# Patient Record
Sex: Female | Born: 1945 | Hispanic: Yes | Marital: Married | Smoking: Former smoker
Health system: Southern US, Community
[De-identification: ages and names within clinical notes are randomized; demographics above are authoritative.]

## PROBLEM LIST (undated history)

## (undated) DIAGNOSIS — M8580 Other specified disorders of bone density and structure, unspecified site: Secondary | ICD-10-CM

## (undated) DIAGNOSIS — E785 Hyperlipidemia, unspecified: Secondary | ICD-10-CM

## (undated) DIAGNOSIS — E039 Hypothyroidism, unspecified: Secondary | ICD-10-CM

## (undated) DIAGNOSIS — E7800 Pure hypercholesterolemia, unspecified: Secondary | ICD-10-CM

## (undated) DIAGNOSIS — R609 Edema, unspecified: Secondary | ICD-10-CM

## (undated) DIAGNOSIS — E049 Nontoxic goiter, unspecified: Secondary | ICD-10-CM

## (undated) DIAGNOSIS — K219 Gastro-esophageal reflux disease without esophagitis: Secondary | ICD-10-CM

## (undated) DIAGNOSIS — E669 Obesity, unspecified: Secondary | ICD-10-CM

## (undated) DIAGNOSIS — R002 Palpitations: Secondary | ICD-10-CM

## (undated) DIAGNOSIS — T753XXA Motion sickness, initial encounter: Secondary | ICD-10-CM

## (undated) HISTORY — PX: EYE SURGERY: SHX253

## (undated) HISTORY — PX: ABDOMINAL HYSTERECTOMY: SHX81

---

## 2015-11-23 ENCOUNTER — Other Ambulatory Visit: Payer: Self-pay | Admitting: Family Medicine

## 2015-11-23 DIAGNOSIS — Z1231 Encounter for screening mammogram for malignant neoplasm of breast: Secondary | ICD-10-CM

## 2015-12-06 ENCOUNTER — Ambulatory Visit: Payer: Self-pay | Attending: Family Medicine

## 2016-04-03 ENCOUNTER — Ambulatory Visit
Admission: RE | Admit: 2016-04-03 | Discharge: 2016-04-03 | Disposition: A | Discharge status: Home / Self Care | Payer: Medicare HMO | Source: Ambulatory Visit | Attending: Family Medicine | Admitting: Family Medicine

## 2016-04-03 ENCOUNTER — Other Ambulatory Visit: Payer: Self-pay | Admitting: Family Medicine

## 2016-04-03 DIAGNOSIS — Z1231 Encounter for screening mammogram for malignant neoplasm of breast: Secondary | ICD-10-CM | POA: Diagnosis not present

## 2016-09-14 ENCOUNTER — Encounter: Payer: Self-pay | Admitting: *Deleted

## 2016-09-17 ENCOUNTER — Encounter: Admission: RE | Payer: Self-pay | Source: Ambulatory Visit

## 2016-09-17 ENCOUNTER — Ambulatory Visit: Admission: RE | Admit: 2016-09-17 | Payer: Medicare HMO | Source: Ambulatory Visit | Admitting: Gastroenterology

## 2016-09-17 HISTORY — DX: Obesity, unspecified: E66.9

## 2016-09-17 HISTORY — DX: Gastro-esophageal reflux disease without esophagitis: K21.9

## 2016-09-17 HISTORY — DX: Hyperlipidemia, unspecified: E78.5

## 2016-09-17 HISTORY — DX: Nontoxic goiter, unspecified: E04.9

## 2016-09-17 HISTORY — DX: Other specified disorders of bone density and structure, unspecified site: M85.80

## 2016-09-17 SURGERY — COLONOSCOPY WITH PROPOFOL
Anesthesia: General

## 2016-11-27 ENCOUNTER — Encounter: Payer: Self-pay | Admitting: *Deleted

## 2016-11-28 ENCOUNTER — Encounter
Admission: RE | Disposition: A | Discharge status: Home / Self Care | Payer: Self-pay | Source: Ambulatory Visit | Attending: Unknown Physician Specialty

## 2016-11-28 ENCOUNTER — Ambulatory Visit
Admission: RE | Admit: 2016-11-28 | Discharge: 2016-11-28 | Disposition: A | Discharge status: Home / Self Care | Payer: Medicare HMO | Source: Ambulatory Visit | Attending: Unknown Physician Specialty | Admitting: Unknown Physician Specialty

## 2016-11-28 ENCOUNTER — Ambulatory Visit: Payer: Medicare HMO | Admitting: Certified Registered Nurse Anesthetist

## 2016-11-28 ENCOUNTER — Encounter: Payer: Self-pay | Admitting: *Deleted

## 2016-11-28 DIAGNOSIS — Z6829 Body mass index (BMI) 29.0-29.9, adult: Secondary | ICD-10-CM | POA: Insufficient documentation

## 2016-11-28 DIAGNOSIS — K640 First degree hemorrhoids: Secondary | ICD-10-CM | POA: Diagnosis not present

## 2016-11-28 DIAGNOSIS — Q438 Other specified congenital malformations of intestine: Secondary | ICD-10-CM | POA: Insufficient documentation

## 2016-11-28 DIAGNOSIS — M8580 Other specified disorders of bone density and structure, unspecified site: Secondary | ICD-10-CM | POA: Diagnosis not present

## 2016-11-28 DIAGNOSIS — K219 Gastro-esophageal reflux disease without esophagitis: Secondary | ICD-10-CM | POA: Insufficient documentation

## 2016-11-28 DIAGNOSIS — Z1211 Encounter for screening for malignant neoplasm of colon: Secondary | ICD-10-CM | POA: Insufficient documentation

## 2016-11-28 DIAGNOSIS — E669 Obesity, unspecified: Secondary | ICD-10-CM | POA: Diagnosis not present

## 2016-11-28 DIAGNOSIS — E7800 Pure hypercholesterolemia, unspecified: Secondary | ICD-10-CM | POA: Diagnosis not present

## 2016-11-28 DIAGNOSIS — Z79899 Other long term (current) drug therapy: Secondary | ICD-10-CM | POA: Diagnosis not present

## 2016-11-28 DIAGNOSIS — Z87891 Personal history of nicotine dependence: Secondary | ICD-10-CM | POA: Insufficient documentation

## 2016-11-28 HISTORY — DX: Pure hypercholesterolemia, unspecified: E78.00

## 2016-11-28 HISTORY — PX: COLONOSCOPY WITH PROPOFOL: SHX5780

## 2016-11-28 SURGERY — COLONOSCOPY WITH PROPOFOL
Anesthesia: General

## 2016-11-28 MED ORDER — MIDAZOLAM HCL 2 MG/2ML IJ SOLN
INTRAMUSCULAR | Status: DC | PRN
  Administered 2016-11-28: 2 mg via INTRAVENOUS

## 2016-11-28 MED ORDER — GLYCOPYRROLATE 0.2 MG/ML IJ SOLN
INTRAMUSCULAR | Status: DC | PRN
  Administered 2016-11-28: 0.2 mg via INTRAVENOUS

## 2016-11-28 MED ORDER — EPHEDRINE SULFATE 50 MG/ML IJ SOLN
INTRAMUSCULAR | Status: DC | PRN
  Administered 2016-11-28: 5 mg via INTRAVENOUS

## 2016-11-28 MED ORDER — SODIUM CHLORIDE 0.9 % IV SOLN
INTRAVENOUS | Status: DC
  Administered 2016-11-28: 15:00:00 via INTRAVENOUS
  Administered 2016-11-28: 1000 mL via INTRAVENOUS

## 2016-11-28 MED ORDER — PROPOFOL 500 MG/50ML IV EMUL
INTRAVENOUS | Status: DC | PRN
  Administered 2016-11-28: 140 ug/kg/min via INTRAVENOUS

## 2016-11-28 MED ORDER — LIDOCAINE HCL (PF) 2 % IJ SOLN
INTRAMUSCULAR | Status: AC
  Filled 2016-11-28: qty 2

## 2016-11-28 MED ORDER — PROPOFOL 10 MG/ML IV BOLUS
INTRAVENOUS | Status: DC | PRN
  Administered 2016-11-28: 30 mg via INTRAVENOUS

## 2016-11-28 MED ORDER — PROPOFOL 500 MG/50ML IV EMUL
INTRAVENOUS | Status: AC
  Filled 2016-11-28: qty 50

## 2016-11-28 MED ORDER — SODIUM CHLORIDE 0.9 % IV SOLN: INTRAVENOUS | Status: DC

## 2016-11-28 MED ORDER — MIDAZOLAM HCL 2 MG/2ML IJ SOLN
INTRAMUSCULAR | Status: AC
  Filled 2016-11-28: qty 2

## 2016-11-28 MED ORDER — LIDOCAINE HCL (CARDIAC) 20 MG/ML IV SOLN
INTRAVENOUS | Status: DC | PRN
  Administered 2016-11-28: 50 mg via INTRAVENOUS

## 2016-11-28 NOTE — H&P (Signed)
   Primary Care Physician:  Marisue IvanLINTHAVONG, KANHKA, MD Primary Gastroenterologist:  Dr. Mechele CollinElliott  Pre-Procedure History & Physical: HPI:  Jennifer AnesLuisa Koslow is a 71 y.o. female is here for an colonoscopy.   Past Medical History:  Diagnosis Date  . Enlarged thyroid   . GERD (gastroesophageal reflux disease)   . Hyperlipidemia   . Obesity   . Obesity   . Osteopenia   . Pure hypercholesterolemia with target low density lipoprotein (LDL) cholesterol less than 130 mg/dL     Past Surgical History:  Procedure Laterality Date  . ABDOMINAL HYSTERECTOMY    . CESAREAN SECTION      Prior to Admission medications   Medication Sig Start Date End Date Taking? Authorizing Provider  esomeprazole (NEXIUM) 40 MG capsule Take 40 mg by mouth daily at 12 noon.   Yes Historical Provider, MD  Melatonin-Pyridoxine (MELATIN PO) Take 1 tablet by mouth daily.   Yes Historical Provider, MD  metoCLOPramide (REGLAN) 5 MG tablet Take 5 mg by mouth 2 (two) times daily before a meal.   Yes Historical Provider, MD  simvastatin (ZOCOR) 20 MG tablet Take 20 mg by mouth daily.   Yes Historical Provider, MD  Zinc Acetate, Oral, (ZINC ACETATE PO) Take 25 mg by mouth daily.   Yes Historical Provider, MD  Calcium Carbonate-Vitamin D (CALTRATE 600+D) 600-400 MG-UNIT tablet Take 2 tablets by mouth daily.    Historical Provider, MD  Folic Acid-Vit B6-Vit B12 (FOLBEE) 2.5-25-1 MG TABS tablet Take 1 tablet by mouth daily.    Historical Provider, MD    Allergies as of 10/23/2016  . (No Known Allergies)    History reviewed. No pertinent family history.  Social History   Social History  . Marital status: Married    Spouse name: N/A  . Number of children: N/A  . Years of education: N/A   Occupational History  . Not on file.   Social History Main Topics  . Smoking status: Former Smoker    Quit date: 09/03/1998  . Smokeless tobacco: Never Used  . Alcohol use No  . Drug use: No  . Sexual activity: Not on file   Other  Topics Concern  . Not on file   Social History Narrative  . No narrative on file    Review of Systems: See HPI, otherwise negative ROS  Physical Exam: BP 131/61   Pulse 72   Temp 99.7 F (37.6 C) (Tympanic)   Resp 20   Ht 5\' 1"  (1.549 m)   Wt 70.3 kg (155 lb)   SpO2 100%   BMI 29.29 kg/m  General:   Alert,  pleasant and cooperative in NAD Head:  Normocephalic and atraumatic. Neck:  Supple; no masses or thyromegaly. Lungs:  Clear throughout to auscultation.    Heart:  Regular rate and rhythm. Abdomen:  Soft, nontender and nondistended. Normal bowel sounds, without guarding, and without rebound.   Neurologic:  Alert and  oriented x4;  grossly normal neurologically.  Impression/Plan: Jennifer Farmer is here for an colonoscopy to be performed for colon cancer screening  Risks, benefits, limitations, and alternatives regarding  colonoscopy have been reviewed with the patient.  Questions have been answered.  All parties agreeable.   Lynnae PrudeELLIOTT, Deandra Gadson, MD  11/28/2016, 3:36 PM

## 2016-11-28 NOTE — Anesthesia Post-op Follow-up Note (Cosign Needed)
Anesthesia QCDR form completed.        

## 2016-11-28 NOTE — Anesthesia Procedure Notes (Signed)
Date/Time: 11/28/2016 3:43 PM Performed by: Ginger CarneMICHELET, Kamariya Blevens Pre-anesthesia Checklist: Patient identified, Emergency Drugs available, Suction available, Patient being monitored and Timeout performed Patient Re-evaluated:Patient Re-evaluated prior to inductionOxygen Delivery Method: Nasal cannula

## 2016-11-28 NOTE — Op Note (Signed)
[facility]enter[facility] Patient Name: Jennifer Farmer Procedure Date: 11/28/2016 3:43 PM MRN: [id number] Account #: [id number] Date of Birth: [birth year] Admit Type: Outpatient Age: 71 Room: [facility] ENDO ROOM 4 Gender: Female Note Status: Finalized Procedure:            Colonoscopy Indications:          Screening for colorectal malignant neoplasm Providers:            Scot Jun, MD Referring MD:         Marisue Ivan (Referring MD) Medicines:            Propofol per Anesthesia Complications:        No immediate complications. Procedure:            Pre-Anesthesia Assessment:                       - After reviewing the risks and benefits, the patient                        was deemed in satisfactory condition to undergo the                        procedure.                       After obtaining informed consent, the colonoscope was                        passed under direct vision. Throughout the procedure,                        the patient's blood pressure, pulse, and oxygen                        saturations were monitored continuously. The                        Colonoscope was introduced through the anus and                        advanced to the the cecum, identified by appendiceal                        orifice and ileocecal valve. The colonoscopy was                        somewhat difficult due to restricted mobility of the                        colon and a tortuous colon. Successful completion of                        the procedure was aided by applying abdominal pressure.                        The patient tolerated the procedure well. The quality                        of the bowel preparation was excellent. Findings:      Internal hemorrhoids were found during endoscopy. The hemorrhoids were  medium-sized and Grade I (internal hemorrhoids that do not prolapse).       The colon was somewhat tortuous and difficult but was able to get  to       cecum.      The exam was otherwise without abnormality. Impression:           - Internal hemorrhoids.                       - The examination was otherwise normal.                       - No specimens collected. Recommendation:       - The findings and recommendations were discussed with                        the patient's family. Repeat in 10 years or not at all.                        Repeat in 5 years if a close relative has colon cancer                        or colon polyps. Scot Junobert T Keandria Berrocal, MD 11/28/2016 4:21:19 PM This report has been signed electronically. Number of Addenda: 0 Note Initiated On: 11/28/2016 3:43 PM Scope Withdrawal Time: 0 hours 10 minutes 2 seconds  Total Procedure Duration: 0 hours 23 minutes 14 seconds       [facility]

## 2016-11-28 NOTE — Anesthesia Preprocedure Evaluation (Signed)
Anesthesia Evaluation  Patient identified by MRN, date of birth, ID band Patient awake    Reviewed: Allergy & Precautions, H&P , NPO status , Patient's Chart, lab work & pertinent test results, reviewed documented beta blocker date and time   History of Anesthesia Complications Negative for: history of anesthetic complications  Airway Mallampati: III  TM Distance: >3 FB Neck ROM: full    Dental  (+) Caps, Implants, Missing   Pulmonary neg pulmonary ROS, former smoker,           Cardiovascular Exercise Tolerance: Good negative cardio ROS       Neuro/Psych negative neurological ROS  negative psych ROS   GI/Hepatic Neg liver ROS, GERD  ,  Endo/Other  negative endocrine ROS  Renal/GU negative Renal ROS  negative genitourinary   Musculoskeletal   Abdominal   Peds  Hematology negative hematology ROS (+)   Anesthesia Other Findings Past Medical History: No date: Enlarged thyroid No date: GERD (gastroesophageal reflux disease) No date: Hyperlipidemia No date: Obesity No date: Obesity No date: Osteopenia No date: Pure hypercholesterolemia with target low dens*   Reproductive/Obstetrics negative OB ROS                             Anesthesia Physical Anesthesia Plan  ASA: II  Anesthesia Plan: General   Post-op Pain Management:    Induction:   Airway Management Planned:   Additional Equipment:   Intra-op Plan:   Post-operative Plan:   Informed Consent: I have reviewed the patients History and Physical, chart, labs and discussed the procedure including the risks, benefits and alternatives for the proposed anesthesia with the patient or authorized representative who has indicated his/her understanding and acceptance.   Dental Advisory Given  Plan Discussed with: Anesthesiologist, CRNA and Surgeon  Anesthesia Plan Comments:         Anesthesia Quick Evaluation

## 2016-11-28 NOTE — Transfer of Care (Signed)
Immediate Anesthesia Transfer of Care Note  Patient: Jennifer Farmer  Procedure(s) Performed: Procedure(s): COLONOSCOPY WITH PROPOFOL (N/A)  Patient Location: PACU  Anesthesia Type:General  Level of Consciousness: sedated  Airway & Oxygen Therapy: Patient Spontanous Breathing and Patient connected to nasal cannula oxygen  Post-op Assessment: Report given to RN and Post -op Vital signs reviewed and stable  Post vital signs: Reviewed and stable  Last Vitals:  Vitals:   11/28/16 1438 11/28/16 1616  BP: 131/61 126/74  Pulse: 72 93  Resp: 20 (!) 21  Temp: 37.6 C 36.2 C    Last Pain:  Vitals:   11/28/16 1616  TempSrc: Axillary      Patients Stated Pain Goal: 0 (11/28/16 1438)  Complications: No apparent anesthesia complications

## 2016-11-29 ENCOUNTER — Encounter: Payer: Self-pay | Admitting: Unknown Physician Specialty

## 2016-12-06 NOTE — Anesthesia Postprocedure Evaluation (Signed)
Anesthesia Post Note  Patient: Jennifer Farmer  Procedure(s) Performed: Procedure(s) (LRB): COLONOSCOPY WITH PROPOFOL (N/A)  Patient location during evaluation: Endoscopy Anesthesia Type: General Level of consciousness: awake and alert Pain management: pain level controlled Vital Signs Assessment: post-procedure vital signs reviewed and stable Respiratory status: spontaneous breathing, nonlabored ventilation, respiratory function stable and patient connected to nasal cannula oxygen Cardiovascular status: blood pressure returned to baseline and stable Postop Assessment: no signs of nausea or vomiting Anesthetic complications: no     Last Vitals:  Vitals:   11/28/16 1616 11/28/16 1645  BP: 126/74 137/72  Pulse: 93   Resp: (!) 21   Temp: 36.2 C     Last Pain:  Vitals:   11/29/16 0902  TempSrc:   PainSc: 0-No pain                 Lenard Simmer

## 2017-03-11 ENCOUNTER — Other Ambulatory Visit: Payer: Self-pay | Admitting: Family Medicine

## 2017-03-11 DIAGNOSIS — Z1231 Encounter for screening mammogram for malignant neoplasm of breast: Secondary | ICD-10-CM

## 2017-06-11 ENCOUNTER — Ambulatory Visit
Admission: RE | Admit: 2017-06-11 | Discharge: 2017-06-11 | Disposition: A | Discharge status: Home / Self Care | Payer: Medicare HMO | Source: Ambulatory Visit | Attending: Family Medicine | Admitting: Family Medicine

## 2017-06-11 DIAGNOSIS — Z1231 Encounter for screening mammogram for malignant neoplasm of breast: Secondary | ICD-10-CM | POA: Diagnosis not present

## 2017-07-04 ENCOUNTER — Encounter: Payer: Medicare HMO | Attending: Family Medicine | Admitting: Dietician

## 2017-07-04 ENCOUNTER — Encounter: Payer: Self-pay | Admitting: Dietician

## 2017-07-04 VITALS — Ht 59.0 in | Wt 161.5 lb

## 2017-07-04 DIAGNOSIS — Z713 Dietary counseling and surveillance: Secondary | ICD-10-CM | POA: Diagnosis not present

## 2017-07-04 DIAGNOSIS — M8580 Other specified disorders of bone density and structure, unspecified site: Secondary | ICD-10-CM | POA: Insufficient documentation

## 2017-07-04 DIAGNOSIS — Z6832 Body mass index (BMI) 32.0-32.9, adult: Secondary | ICD-10-CM | POA: Diagnosis not present

## 2017-07-04 DIAGNOSIS — K219 Gastro-esophageal reflux disease without esophagitis: Secondary | ICD-10-CM

## 2017-07-04 DIAGNOSIS — E7800 Pure hypercholesterolemia, unspecified: Secondary | ICD-10-CM | POA: Diagnosis not present

## 2017-07-04 DIAGNOSIS — E669 Obesity, unspecified: Secondary | ICD-10-CM | POA: Diagnosis present

## 2017-07-04 DIAGNOSIS — E785 Hyperlipidemia, unspecified: Secondary | ICD-10-CM

## 2017-07-04 NOTE — Progress Notes (Signed)
Medical Nutrition Therapy: Visit start time: 1430  end time: 1530  Assessment:  Diagnosis: obesity, hyperlipidemia Past medical history: GERD, osteopenia Psychosocial issues/ stress concerns: none Preferred learning method:  Jill Alexanders. Visual . Hands-on  Current weight: 161.5lbs  Height: 4'11" Medications, supplements: reconciled list in medical record  Progress and evaluation: Patient reports weight gain over past several months, reports enlarged thyroid but normal function. 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 Can't sleep if she eats dinner late or large meal. Avoids fried foods, tomato-based foods, large meals. She reports improvement in her cholesterol, but she is most frustrated with inability to lose weight at this point. She and her husband spend several months each year in [place] and exercise decreases when there.   Physical activity: dancing, cardio, weights 60 minutes, 3-4 times a week  Dietary Intake:  Usual eating pattern includes 3 meals and 1 snacks per day. Dining out frequency: 2 meals per month.  Breakfast: cereal with berries, and toast; or oatmeal with nuts, sometimes with 1 slice toast.  Snack: none Lunch: cottage cheese with fruit blueberries; apple with peanut butter; salmon patty with English muffin, sometimes yogurt with lunch; sometimes smoothie Snack: none Supper: chicken, meatballs with pasta, sweet potatoes. Small portions. Eats about 5pm even if not hungry, to avoid eating too late. Goes to bed at 9pm, sleep at 10pm Snack: 7-7:30pm 1 cookie, or 1 date after dinner Beverages: water, coffee no sugar; no other beverages  Nutrition Care Education: Topics covered: weight control, hyperlipidemia Basic nutrition: basic food groups, appropriate nutrient balance, appropriate meal and snack schedule, general nutrition guidelines    Weight control: determining reasonable weight loss rate, energy and nutrient needs for weight  loss calculated at 1200kcal daily; provided meal plan with 45%CHO, 25%protein, and 30% fat per patient request, and wrote individualized menus based on patient's diet history. Discussed factors that affect weight and metabolism.  GI symptoms: Encouraged small amounts of food regularly throughout the day (patient reports no hunger), ongoing avoidance of high fat, spicy, and acidic foods, avoidance of eating within 1 hour of bedtime, choosing easy-to-digest foods.  Hyperlipidemia: healthy and unhealthy fats, role of fiber, role of weight control and exercise    Nutritional Diagnosis:  [place]-1.4 Altered GI function As related to GERD and slow digestion.  As evidenced by patient's symptoms of fulness, bloating, lack of hunger symptoms. [place]-3.3 Overweight/obesity As related to history of excess calories, periodic inactivity.  As evidenced by BMI 32.5, patient report.  Intervention: Instruction as noted above.   Set goals with input from patient.    She is making healthy food choices, engaging in regular exercise.    She will monitor her intake and aim for 1200kcal daily.   Education Materials given:  . Food lists/ Planning A Heart Healthy Meal . Sample meal pattern/ menus . Gastroparesis Nutrition Therapy (NCM) . Goals/ instructions  Learner/ who was taught:  . Patient   Level of understanding: Marland Kitchen. Verbalizes/ demonstrates competency  Demonstrated degree of understanding via:   Teach back Learning barriers: . None  Willingness to learn/ readiness for change: . Eager, change in progress  Monitoring and Evaluation:  Dietary intake, exercise, GI symptoms, lipid levels, and body weight      follow up: 09/12/17

## 2017-07-04 NOTE — Patient Instructions (Signed)
   Keep up your healthy food choices and regular exercise, you are doing a great job!  Consider keeping a detailed food diary of what and how much you eat, as well as how hungry you feel or other digestive symptoms. This might pinpoint some areas for change to either increase or decrease calories.   Follow plan for 1200 calories daily to meet basic nutrition needs.

## 2017-09-12 ENCOUNTER — Ambulatory Visit: Payer: Medicare HMO | Admitting: Dietician

## 2017-09-16 ENCOUNTER — Encounter: Payer: Self-pay | Admitting: *Deleted

## 2017-09-19 ENCOUNTER — Encounter
Admission: RE | Disposition: A | Discharge status: Home / Self Care | Payer: Self-pay | Source: Ambulatory Visit | Attending: Ophthalmology

## 2017-09-19 ENCOUNTER — Ambulatory Visit
Admission: RE | Admit: 2017-09-19 | Discharge: 2017-09-19 | Disposition: A | Discharge status: Home / Self Care | Payer: Medicare FFS | Source: Ambulatory Visit | Attending: Ophthalmology | Admitting: Ophthalmology

## 2017-09-19 ENCOUNTER — Ambulatory Visit: Payer: Medicare FFS | Admitting: Anesthesiology

## 2017-09-19 DIAGNOSIS — E7800 Pure hypercholesterolemia, unspecified: Secondary | ICD-10-CM | POA: Diagnosis not present

## 2017-09-19 DIAGNOSIS — M810 Age-related osteoporosis without current pathological fracture: Secondary | ICD-10-CM | POA: Diagnosis not present

## 2017-09-19 DIAGNOSIS — Z79899 Other long term (current) drug therapy: Secondary | ICD-10-CM | POA: Insufficient documentation

## 2017-09-19 DIAGNOSIS — K219 Gastro-esophageal reflux disease without esophagitis: Secondary | ICD-10-CM | POA: Insufficient documentation

## 2017-09-19 DIAGNOSIS — H2511 Age-related nuclear cataract, right eye: Secondary | ICD-10-CM | POA: Diagnosis present

## 2017-09-19 DIAGNOSIS — Z87891 Personal history of nicotine dependence: Secondary | ICD-10-CM | POA: Insufficient documentation

## 2017-09-19 HISTORY — DX: Edema, unspecified: R60.9

## 2017-09-19 HISTORY — PX: CATARACT EXTRACTION W/PHACO: SHX586

## 2017-09-19 HISTORY — DX: Palpitations: R00.2

## 2017-09-19 HISTORY — DX: Hypothyroidism, unspecified: E03.9

## 2017-09-19 SURGERY — PHACOEMULSIFICATION, CATARACT, WITH IOL INSERTION
Anesthesia: Monitor Anesthesia Care | Site: Eye | Laterality: Right | Wound class: Clean

## 2017-09-19 MED ORDER — CARBACHOL 0.01 % IO SOLN
INTRAOCULAR | Status: DC | PRN
  Administered 2017-09-19: 0.5 mL via INTRAOCULAR

## 2017-09-19 MED ORDER — POVIDONE-IODINE 5 % OP SOLN
OPHTHALMIC | Status: DC | PRN
  Administered 2017-09-19: 1 via OPHTHALMIC

## 2017-09-19 MED ORDER — MOXIFLOXACIN HCL 0.5 % OP SOLN
OPHTHALMIC | Status: DC | PRN
  Administered 2017-09-19: 0.2 mL via OPHTHALMIC

## 2017-09-19 MED ORDER — EPINEPHRINE PF 1 MG/ML IJ SOLN
INTRAMUSCULAR | Status: AC
  Filled 2017-09-19: qty 2

## 2017-09-19 MED ORDER — MOXIFLOXACIN HCL 0.5 % OP SOLN
OPHTHALMIC | Status: AC
  Filled 2017-09-19: qty 3

## 2017-09-19 MED ORDER — LIDOCAINE HCL (PF) 4 % IJ SOLN
INTRAMUSCULAR | Status: AC
  Filled 2017-09-19: qty 5

## 2017-09-19 MED ORDER — FENTANYL CITRATE (PF) 100 MCG/2ML IJ SOLN
INTRAMUSCULAR | Status: AC
  Filled 2017-09-19: qty 2

## 2017-09-19 MED ORDER — MOXIFLOXACIN HCL 0.5 % OP SOLN: 1.0000 [drp] | OPHTHALMIC | Status: DC | PRN

## 2017-09-19 MED ORDER — BSS IO SOLN
INTRAOCULAR | Status: DC | PRN
  Administered 2017-09-19: 4 mL via OPHTHALMIC

## 2017-09-19 MED ORDER — SODIUM HYALURONATE 23 MG/ML IO SOLN
INTRAOCULAR | Status: DC | PRN
  Administered 2017-09-19: 0.6 mL via INTRAOCULAR

## 2017-09-19 MED ORDER — SODIUM HYALURONATE 10 MG/ML IO SOLN
INTRAOCULAR | Status: DC | PRN
  Administered 2017-09-19: 0.55 mL via INTRAOCULAR

## 2017-09-19 MED ORDER — SODIUM CHLORIDE 0.9 % IV SOLN
INTRAVENOUS | Status: DC
  Administered 2017-09-19: 08:00:00 via INTRAVENOUS

## 2017-09-19 MED ORDER — POVIDONE-IODINE 5 % OP SOLN
OPHTHALMIC | Status: AC
  Filled 2017-09-19: qty 30

## 2017-09-19 MED ORDER — ARMC OPHTHALMIC DILATING DROPS
OPHTHALMIC | Status: AC
  Administered 2017-09-19: 1 via OPHTHALMIC
  Filled 2017-09-19: qty 0.4

## 2017-09-19 MED ORDER — MIDAZOLAM HCL 2 MG/2ML IJ SOLN
INTRAMUSCULAR | Status: DC | PRN
  Administered 2017-09-19 (×2): 1 mg via INTRAVENOUS

## 2017-09-19 MED ORDER — ARMC OPHTHALMIC DILATING DROPS
1.0000 "application " | OPHTHALMIC | Status: AC
  Administered 2017-09-19 (×3): 1 via OPHTHALMIC

## 2017-09-19 MED ORDER — FENTANYL CITRATE (PF) 100 MCG/2ML IJ SOLN
INTRAMUSCULAR | Status: DC | PRN
  Administered 2017-09-19: 50 ug via INTRAVENOUS
  Administered 2017-09-19 (×2): 25 ug via INTRAVENOUS

## 2017-09-19 MED ORDER — BSS IO SOLN
INTRAOCULAR | Status: DC | PRN
  Administered 2017-09-19: 09:00:00 via OPHTHALMIC

## 2017-09-19 MED ORDER — MIDAZOLAM HCL 2 MG/2ML IJ SOLN
INTRAMUSCULAR | Status: AC
  Filled 2017-09-19: qty 2

## 2017-09-19 MED ORDER — SODIUM HYALURONATE 23 MG/ML IO SOLN
INTRAOCULAR | Status: AC
  Filled 2017-09-19: qty 0.6

## 2017-09-19 SURGICAL SUPPLY — 16 items
DISSECTOR HYDRO NUCLEUS 50X22 (MISCELLANEOUS) ×3 IMPLANT
GLOVE BIO SURGEON STRL SZ8 (GLOVE) ×3 IMPLANT
GLOVE BIOGEL M 6.5 STRL (GLOVE) ×3 IMPLANT
GLOVE SURG LX 7.5 STRW (GLOVE) ×2
GLOVE SURG LX STRL 7.5 STRW (GLOVE) ×1 IMPLANT
GOWN STRL REUS W/ TWL LRG LVL3 (GOWN DISPOSABLE) ×2 IMPLANT
GOWN STRL REUS W/TWL LRG LVL3 (GOWN DISPOSABLE) ×4
LABEL CATARACT MEDS ST (LABEL) ×3 IMPLANT
LENS IOL TECNIS ITEC 20.5 (Intraocular Lens) ×3 IMPLANT
PACK CATARACT (MISCELLANEOUS) ×3 IMPLANT
PACK CATARACT KING (MISCELLANEOUS) ×3 IMPLANT
PACK EYE AFTER SURG (MISCELLANEOUS) ×3 IMPLANT
SOL BSS BAG (MISCELLANEOUS) ×3
SOLUTION BSS BAG (MISCELLANEOUS) ×1 IMPLANT
WATER STERILE IRR 250ML POUR (IV SOLUTION) ×3 IMPLANT
WIPE NON LINTING 3.25X3.25 (MISCELLANEOUS) ×3 IMPLANT

## 2017-09-19 NOTE — Op Note (Signed)
OPERATIVE NOTE  Jennifer Farmer [id number] 09/19/2017   PREOPERATIVE DIAGNOSIS:  Nuclear sclerotic cataract right eye.  H25.11   POSTOPERATIVE DIAGNOSIS:    Nuclear sclerotic cataract right eye.     PROCEDURE:  Phacoemusification with posterior chamber intraocular lens placement of the right eye   LENS:   Implant Name Type Inv. Item Serial No. Manufacturer Lot No. LRB No. Used  LENS IOL DIOP 20.5 - [id number] Intraocular Lens LENS IOL DIOP 20.5 [id number] AMO  Right 1       PCB00 +20.5   ULTRASOUND TIME: 0 minutes 17.1 seconds.  CDE 1.49   SURGEON:  Willey BladeBradley King, MD, MPH  ANESTHESIOLOGIST: Anesthesiologist: Berdine Addisonhomas, Mathai, MD CRNA: Tonia Ghentook-Martin, Cindy   ANESTHESIA:  Topical with tetracaine drops augmented with 1% preservative-free intracameral lidocaine.  ESTIMATED BLOOD LOSS: less than 1 mL.   COMPLICATIONS:  None.   DESCRIPTION OF PROCEDURE:  The patient was identified in the holding room and transported to the operating room and placed in the supine position under the operating microscope.  The right eye was identified as the operative eye and it was prepped and draped in the usual sterile ophthalmic fashion.   A 1.0 millimeter clear-corneal paracentesis was made at the 10:30 position. 0.5 ml of preservative-free 1% lidocaine with epinephrine was injected into the anterior chamber.  The anterior chamber was filled with Healon 5 viscoelastic.  A 2.4 millimeter keratome was used to make a near-clear corneal incision at the 8:00 position.  A curvilinear capsulorrhexis was made with a cystotome and capsulorrhexis forceps.  Balanced salt solution was used to hydrodissect and hydrodelineate the nucleus.   Phacoemulsification was then used in stop and chop fashion to remove the lens nucleus and epinucleus.  The remaining cortex was then removed using the irrigation and aspiration handpiece. Healon was then placed into the capsular bag to distend it for lens placement.  A lens was  then injected into the capsular bag.  The remaining viscoelastic was aspirated.   Wounds were hydrated with balanced salt solution.  The anterior chamber was inflated to a physiologic pressure with balanced salt solution.   Intracameral vigamox 0.1 mL undiluted was injected into the eye and a drop placed onto the ocular surface.  No wound leaks were noted.  The patient was taken to the recovery room in stable condition without complications of anesthesia or surgery  Willey BladeBradley King 09/19/2017, 9:37 AM

## 2017-09-19 NOTE — Transfer of Care (Signed)
Immediate Anesthesia Transfer of Care Note  Patient: Jennifer Farmer  Procedure(s) Performed: CATARACT EXTRACTION PHACO AND INTRAOCULAR LENS PLACEMENT (IOC) (Right Eye)  Patient Location: PACU and Short Stay  Anesthesia Type:General  Level of Consciousness: awake and sedated  Airway & Oxygen Therapy: Patient Spontanous Breathing  Post-op Assessment: Report given to RN  Post vital signs: Reviewed and stable  Last Vitals:  Vitals:   09/19/17 0740  BP: (!) 159/80  Pulse: 72  Resp: 16  Temp: 36.7 C  SpO2: 99%    Last Pain:  Vitals:   09/19/17 0740  TempSrc: Tympanic         Complications: No apparent anesthesia complications

## 2017-09-19 NOTE — Anesthesia Postprocedure Evaluation (Signed)
Anesthesia Post Note  Patient: Jennifer Farmer  Procedure(s) Performed: CATARACT EXTRACTION PHACO AND INTRAOCULAR LENS PLACEMENT (IOC) (Right Eye)  Patient location during evaluation: PACU Anesthesia Type: MAC Level of consciousness: awake and alert Pain management: pain level controlled Vital Signs Assessment: post-procedure vital signs reviewed and stable Respiratory status: spontaneous breathing, nonlabored ventilation, respiratory function stable and patient connected to nasal cannula oxygen Cardiovascular status: stable and blood pressure returned to baseline Postop Assessment: no apparent nausea or vomiting Anesthetic complications: no     Last Vitals:  Vitals:   09/19/17 0937 09/19/17 0951  BP: (!) 153/75 129/89  Pulse: 70 77  Resp: 16   Temp: (!) 35.8 C   SpO2: 100% 100%    Last Pain:  Vitals:   09/19/17 0937  TempSrc: Temporal                 Holger Sokolowski S

## 2017-09-19 NOTE — Discharge Instructions (Signed)
Eye Surgery Discharge Instructions  Expect mild scratchy sensation or mild soreness. DO NOT RUB YOUR EYE!  The day of surgery:  Minimal physical activity, but bed rest is not required  No reading, computer work, or close hand work  No bending, lifting, or straining.  May watch TV  For 24 hours:  No driving, legal decisions, or alcoholic beverages  Safety precautions  Eat anything you prefer: It is better to start with liquids, then soup then solid foods.  _____ Eye patch should be worn until postoperative exam tomorrow.  ____ Solar shield eyeglasses should be worn for comfort in the sunlight/patch while sleeping  Resume all regular medications including aspirin or Coumadin if these were discontinued prior to surgery. You may shower, bathe, shave, or wash your hair. Tylenol may be taken for mild discomfort.  Call your doctor if you experience significant pain, nausea, or vomiting, fever > 101 or other signs of infection. [phone] or [phone] Specific instructions:  Follow-up Information    [place] CraneKing, Bradley Mark, MD Follow up.   Specialty:  Ophthalmology Why:  [facility] Contact information: [address] [place] [address] [phone]

## 2017-09-19 NOTE — Anesthesia Procedure Notes (Signed)
Performed by: Cook-Martin, Zechariah Bissonnette Pre-anesthesia Checklist: Patient identified, Emergency Drugs available, Suction available, Patient being monitored and Timeout performed Patient Re-evaluated:Patient Re-evaluated prior to induction Oxygen Delivery Method: Nasal cannula Preoxygenation: Pre-oxygenation with 100% oxygen Induction Type: IV induction Placement Confirmation: CO2 detector and positive ETCO2       

## 2017-09-19 NOTE — H&P (Signed)
The History and Physical notes are on paper, have been signed, and are to be scanned.   I have examined the patient and there are no changes to the H&P.   Willey BladeBradley Mohamad Bruso 09/19/2017 9:04 AM

## 2017-09-19 NOTE — Anesthesia Preprocedure Evaluation (Signed)
Anesthesia Evaluation  Patient identified by MRN, date of birth, ID band Patient awake    Reviewed: Allergy & Precautions, NPO status , Patient's Chart, lab work & pertinent test results, reviewed documented beta blocker date and time   Airway Mallampati: III  TM Distance: >3 FB     Dental  (+) Chipped   Pulmonary former smoker,           Cardiovascular      Neuro/Psych    GI/Hepatic GERD  Controlled,  Endo/Other  Hypothyroidism   Renal/GU      Musculoskeletal   Abdominal   Peds  Hematology   Anesthesia Other Findings   Reproductive/Obstetrics                             Anesthesia Physical Anesthesia Plan  ASA: III  Anesthesia Plan: MAC   Post-op Pain Management:    Induction:   PONV Risk Score and Plan:   Airway Management Planned:   Additional Equipment:   Intra-op Plan:   Post-operative Plan:   Informed Consent: I have reviewed the patients History and Physical, chart, labs and discussed the procedure including the risks, benefits and alternatives for the proposed anesthesia with the patient or authorized representative who has indicated his/her understanding and acceptance.     Plan Discussed with: CRNA  Anesthesia Plan Comments:         Anesthesia Quick Evaluation

## 2017-09-19 NOTE — Anesthesia Post-op Follow-up Note (Signed)
Anesthesia QCDR form completed.        

## 2017-09-20 ENCOUNTER — Encounter: Payer: Self-pay | Admitting: Ophthalmology

## 2017-09-26 ENCOUNTER — Other Ambulatory Visit: Payer: Self-pay | Admitting: Family Medicine

## 2017-09-26 DIAGNOSIS — M25562 Pain in left knee: Secondary | ICD-10-CM

## 2017-10-02 ENCOUNTER — Encounter: Payer: Self-pay | Admitting: *Deleted

## 2017-10-03 ENCOUNTER — Ambulatory Visit: Payer: Medicare FFS

## 2017-10-09 MED ORDER — ARMC OPHTHALMIC DILATING DROPS: 1.0000 "application " | OPHTHALMIC | Status: DC

## 2017-10-10 ENCOUNTER — Other Ambulatory Visit: Payer: Self-pay

## 2017-10-10 ENCOUNTER — Ambulatory Visit: Payer: Medicare HMO | Admitting: Anesthesiology

## 2017-10-10 ENCOUNTER — Ambulatory Visit
Admission: RE | Admit: 2017-10-10 | Discharge: 2017-10-10 | Disposition: A | Discharge status: Home / Self Care | Payer: Medicare HMO | Source: Ambulatory Visit | Attending: Ophthalmology | Admitting: Ophthalmology

## 2017-10-10 ENCOUNTER — Encounter: Payer: Self-pay | Admitting: *Deleted

## 2017-10-10 ENCOUNTER — Encounter
Admission: RE | Disposition: A | Discharge status: Home / Self Care | Payer: Self-pay | Source: Ambulatory Visit | Attending: Ophthalmology

## 2017-10-10 DIAGNOSIS — M810 Age-related osteoporosis without current pathological fracture: Secondary | ICD-10-CM | POA: Diagnosis not present

## 2017-10-10 DIAGNOSIS — E7800 Pure hypercholesterolemia, unspecified: Secondary | ICD-10-CM | POA: Diagnosis not present

## 2017-10-10 DIAGNOSIS — E039 Hypothyroidism, unspecified: Secondary | ICD-10-CM | POA: Diagnosis not present

## 2017-10-10 DIAGNOSIS — K219 Gastro-esophageal reflux disease without esophagitis: Secondary | ICD-10-CM | POA: Insufficient documentation

## 2017-10-10 DIAGNOSIS — Z888 Allergy status to other drugs, medicaments and biological substances status: Secondary | ICD-10-CM | POA: Diagnosis not present

## 2017-10-10 DIAGNOSIS — H2512 Age-related nuclear cataract, left eye: Secondary | ICD-10-CM | POA: Diagnosis present

## 2017-10-10 DIAGNOSIS — Z79899 Other long term (current) drug therapy: Secondary | ICD-10-CM | POA: Insufficient documentation

## 2017-10-10 DIAGNOSIS — Z87891 Personal history of nicotine dependence: Secondary | ICD-10-CM | POA: Diagnosis not present

## 2017-10-10 HISTORY — PX: CATARACT EXTRACTION W/PHACO: SHX586

## 2017-10-10 SURGERY — PHACOEMULSIFICATION, CATARACT, WITH IOL INSERTION
Anesthesia: Monitor Anesthesia Care | Laterality: Left

## 2017-10-10 MED ORDER — FENTANYL CITRATE (PF) 100 MCG/2ML IJ SOLN
INTRAMUSCULAR | Status: AC
  Filled 2017-10-10: qty 2

## 2017-10-10 MED ORDER — POVIDONE-IODINE 5 % OP SOLN
OPHTHALMIC | Status: DC | PRN
  Administered 2017-10-10: 1 via OPHTHALMIC

## 2017-10-10 MED ORDER — EPINEPHRINE PF 1 MG/ML IJ SOLN
INTRAMUSCULAR | Status: AC
  Filled 2017-10-10: qty 1

## 2017-10-10 MED ORDER — MOXIFLOXACIN HCL 0.5 % OP SOLN
OPHTHALMIC | Status: DC | PRN
  Administered 2017-10-10: 0.2 mL via OPHTHALMIC

## 2017-10-10 MED ORDER — LIDOCAINE HCL (PF) 4 % IJ SOLN
INTRAMUSCULAR | Status: AC
  Filled 2017-10-10: qty 5

## 2017-10-10 MED ORDER — ARMC OPHTHALMIC DILATING DROPS
OPHTHALMIC | Status: AC
  Filled 2017-10-10: qty 0.4

## 2017-10-10 MED ORDER — SODIUM CHLORIDE 0.9 % IV SOLN
INTRAVENOUS | Status: DC
  Administered 2017-10-10: 09:00:00 via INTRAVENOUS

## 2017-10-10 MED ORDER — MOXIFLOXACIN HCL 0.5 % OP SOLN
OPHTHALMIC | Status: AC
  Filled 2017-10-10: qty 3

## 2017-10-10 MED ORDER — BSS IO SOLN
INTRAOCULAR | Status: DC | PRN
  Administered 2017-10-10: .1 mL via OPHTHALMIC

## 2017-10-10 MED ORDER — BSS IO SOLN
INTRAOCULAR | Status: DC | PRN
  Administered 2017-10-10: 1 via INTRAOCULAR

## 2017-10-10 MED ORDER — SODIUM HYALURONATE 23 MG/ML IO SOLN
INTRAOCULAR | Status: DC | PRN
  Administered 2017-10-10: 0.6 mL via INTRAOCULAR

## 2017-10-10 MED ORDER — POVIDONE-IODINE 5 % OP SOLN
OPHTHALMIC | Status: AC
  Filled 2017-10-10: qty 30

## 2017-10-10 MED ORDER — NON FORMULARY
1.0000 [drp] | Status: AC
  Administered 2017-10-10 (×3): 1 [drp] via OPHTHALMIC

## 2017-10-10 MED ORDER — MOXIFLOXACIN HCL 0.5 % OP SOLN: 1.0000 [drp] | OPHTHALMIC | Status: DC | PRN

## 2017-10-10 MED ORDER — MIDAZOLAM HCL 2 MG/2ML IJ SOLN
INTRAMUSCULAR | Status: DC | PRN
  Administered 2017-10-10: 1 mg via INTRAVENOUS

## 2017-10-10 MED ORDER — TETRACAINE HCL 0.5 % OP SOLN
1.0000 [drp] | OPHTHALMIC | Status: AC
  Administered 2017-10-10 (×3): 1 [drp] via OPHTHALMIC

## 2017-10-10 MED ORDER — SODIUM HYALURONATE 23 MG/ML IO SOLN
INTRAOCULAR | Status: AC
  Filled 2017-10-10: qty 0.6

## 2017-10-10 MED ORDER — MIDAZOLAM HCL 2 MG/2ML IJ SOLN
INTRAMUSCULAR | Status: AC
  Filled 2017-10-10: qty 2

## 2017-10-10 MED ORDER — TETRACAINE HCL 0.5 % OP SOLN
OPHTHALMIC | Status: AC
  Filled 2017-10-10: qty 4

## 2017-10-10 MED ORDER — FENTANYL CITRATE (PF) 100 MCG/2ML IJ SOLN
INTRAMUSCULAR | Status: DC | PRN
  Administered 2017-10-10: 50 ug via INTRAVENOUS

## 2017-10-10 MED ORDER — SODIUM HYALURONATE 10 MG/ML IO SOLN
INTRAOCULAR | Status: DC | PRN
  Administered 2017-10-10: 0.55 mL via INTRAOCULAR

## 2017-10-10 SURGICAL SUPPLY — 18 items
DISSECTOR HYDRO NUCLEUS 50X22 (MISCELLANEOUS) ×3 IMPLANT
GLOVE BIO SURGEON STRL SZ8 (GLOVE) ×3 IMPLANT
GLOVE BIOGEL M 6.5 STRL (GLOVE) ×3 IMPLANT
GLOVE SURG LX 7.5 STRW (GLOVE) ×2
GLOVE SURG LX STRL 7.5 STRW (GLOVE) ×1 IMPLANT
GOWN STRL REUS W/ TWL LRG LVL3 (GOWN DISPOSABLE) ×2 IMPLANT
GOWN STRL REUS W/TWL LRG LVL3 (GOWN DISPOSABLE) ×4
LABEL CATARACT MEDS ST (LABEL) ×3 IMPLANT
LENS IOL TECNIS ITEC 21.0 (Intraocular Lens) ×3 IMPLANT
PACK CATARACT (MISCELLANEOUS) ×3 IMPLANT
PACK CATARACT KING (MISCELLANEOUS) ×3 IMPLANT
PACK EYE AFTER SURG (MISCELLANEOUS) ×3 IMPLANT
SOL BAL SALT 15ML (MISCELLANEOUS) ×3
SOL BSS BAG (MISCELLANEOUS) ×3
SOLUTION BAL SALT 15ML (MISCELLANEOUS) ×1 IMPLANT
SOLUTION BSS BAG (MISCELLANEOUS) ×1 IMPLANT
WATER STERILE IRR 250ML POUR (IV SOLUTION) ×3 IMPLANT
WIPE NON LINTING 3.25X3.25 (MISCELLANEOUS) ×3 IMPLANT

## 2017-10-10 NOTE — Discharge Instructions (Signed)
Eye Surgery Discharge Instructions  Expect mild scratchy sensation or mild soreness. DO NOT RUB YOUR EYE!  The day of surgery:  Minimal physical activity, but bed rest is not required  No reading, computer work, or close hand work  No bending, lifting, or straining.  May watch TV  For 24 hours:  No driving, legal decisions, or alcoholic beverages  Safety precautions  Eat anything you prefer: It is better to start with liquids, then soup then solid foods.  _____ Eye patch should be worn until postoperative exam tomorrow.  ____ Solar shield eyeglasses should be worn for comfort in the sunlight/patch while sleeping  Resume all regular medications including aspirin or Coumadin if these were discontinued prior to surgery. You may shower, bathe, shave, or wash your hair. Tylenol may be taken for mild discomfort.  Call your doctor if you experience significant pain, nausea, or vomiting, fever > 101 or other signs of infection. [phone] or [phone] Specific instructions:  Follow-up Information    Galen ManilaPorfilio, William, MD Follow up.   Specialty:  Ophthalmology Why:  February 8 at 10:20am Contact information: [address] [place] [address] [phone]

## 2017-10-10 NOTE — Anesthesia Post-op Follow-up Note (Signed)
Anesthesia QCDR form completed.        

## 2017-10-10 NOTE — Op Note (Signed)
OPERATIVE NOTE  Jennifer Farmer [id number] 10/10/2017   PREOPERATIVE DIAGNOSIS:  Nuclear sclerotic cataract left eye.  H25.12   POSTOPERATIVE DIAGNOSIS:    Nuclear sclerotic cataract left eye.     PROCEDURE:  Phacoemusification with posterior chamber intraocular lens placement of the left eye   LENS:   Implant Name Type Inv. Item Serial No. Manufacturer Lot No. LRB No. Used  LENS IOL DIOP 21.0 - [id number] Intraocular Lens LENS IOL DIOP 21.0 [id number] AMO  Left 1       PCB00 +21.0   ULTRASOUND TIME: 0 minutes 20.5 seconds.  CDE 2.19   SURGEON:  Willey BladeBradley Elanna Bert, MD, MPH   ANESTHESIA:  Topical with tetracaine drops augmented with 1% preservative-free intracameral lidocaine.  ESTIMATED BLOOD LOSS: <1 mL   COMPLICATIONS:  None.   DESCRIPTION OF PROCEDURE:  The patient was identified in the holding room and transported to the operating room and placed in the supine position under the operating microscope.  The left eye was identified as the operative eye and it was prepped and draped in the usual sterile ophthalmic fashion.   A 1.0 millimeter clear-corneal paracentesis was made at the 5:00 position. 0.5 ml of preservative-free 1% lidocaine with epinephrine was injected into the anterior chamber.  The anterior chamber was filled with Healon 5 viscoelastic.  A 2.4 millimeter keratome was used to make a near-clear corneal incision at the 2:00 position.  A curvilinear capsulorrhexis was made with a cystotome and capsulorrhexis forceps.  Balanced salt solution was used to hydrodissect and hydrodelineate the nucleus.   Phacoemulsification was then used in stop and chop fashion to remove the lens nucleus and epinucleus.  The remaining cortex was then removed using the irrigation and aspiration handpiece. Healon was then placed into the capsular bag to distend it for lens placement.  A lens was then injected into the capsular bag.  The remaining viscoelastic was aspirated.   Wounds were  hydrated with balanced salt solution.  The anterior chamber was inflated to a physiologic pressure with balanced salt solution.  Intracameral vigamox 0.1 mL undiltued was injected into the eye and a drop placed onto the ocular surface.  No wound leaks were noted.  The patient was taken to the recovery room in stable condition without complications of anesthesia or surgery  Willey BladeBradley Kenneith Stief 10/10/2017, 11:17 AM

## 2017-10-10 NOTE — Transfer of Care (Signed)
Immediate Anesthesia Transfer of Care Note  Patient: Jennifer Farmer  Procedure(s) Performed: CATARACT EXTRACTION PHACO AND INTRAOCULAR LENS PLACEMENT (IOC) (Left )  Patient Location: PACU  Anesthesia Type:MAC  Level of Consciousness: awake, alert  and oriented  Airway & Oxygen Therapy: Patient Spontanous Breathing  Post-op Assessment: Report given to RN and Post -op Vital signs reviewed and stable  Post vital signs: Reviewed and stable  Last Vitals:  Vitals:   10/10/17 0813 10/10/17 1119  BP: (!) 148/73 137/67  Pulse: 71   Resp: 16   Temp: (!) 36.3 C (!) 36.2 C  SpO2: 100% 100%    Last Pain:  Vitals:   10/10/17 0813  TempSrc: Oral         Complications: No apparent anesthesia complications

## 2017-10-10 NOTE — Anesthesia Preprocedure Evaluation (Signed)
Anesthesia Evaluation  Patient identified by MRN, date of birth, ID band Patient awake    Reviewed: Allergy & Precautions, NPO status , Patient's Chart, lab work & pertinent test results, reviewed documented beta blocker date and time   Airway Mallampati: III  TM Distance: >3 FB     Dental  (+) Chipped   Pulmonary former smoker,           Cardiovascular      Neuro/Psych    GI/Hepatic GERD  Controlled,  Endo/Other  Hypothyroidism   Renal/GU      Musculoskeletal   Abdominal   Peds  Hematology   Anesthesia Other Findings   Reproductive/Obstetrics                             Anesthesia Physical Anesthesia Plan  ASA: III  Anesthesia Plan: MAC   Post-op Pain Management:    Induction:   PONV Risk Score and Plan:   Airway Management Planned:   Additional Equipment:   Intra-op Plan:   Post-operative Plan:   Informed Consent: I have reviewed the patients History and Physical, chart, labs and discussed the procedure including the risks, benefits and alternatives for the proposed anesthesia with the patient or authorized representative who has indicated his/her understanding and acceptance.     Plan Discussed with: CRNA  Anesthesia Plan Comments:         Anesthesia Quick Evaluation

## 2017-10-10 NOTE — H&P (Signed)
The History and Physical notes are on paper, have been signed, and are to be scanned.   I have examined the patient and there are no changes to the H&P.   Willey BladeBradley King 10/10/2017 10:45 AM

## 2017-10-10 NOTE — Anesthesia Postprocedure Evaluation (Signed)
Anesthesia Post Note  Patient: Jennifer Farmer  Procedure(s) Performed: CATARACT EXTRACTION PHACO AND INTRAOCULAR LENS PLACEMENT (IOC) (Left )  Patient location during evaluation: PACU Anesthesia Type: MAC Level of consciousness: awake Pain management: pain level controlled Vital Signs Assessment: post-procedure vital signs reviewed and stable Respiratory status: spontaneous breathing Cardiovascular status: blood pressure returned to baseline Postop Assessment: no apparent nausea or vomiting Anesthetic complications: no     Last Vitals:  Vitals:   10/10/17 0813 10/10/17 1119  BP: (!) 148/73 137/67  Pulse: 71   Resp: 16   Temp: (!) 36.3 C (!) 36.2 C  SpO2: 100% 100%    Last Pain:  Vitals:   10/10/17 0813  TempSrc: Oral                 Hedda Slade

## 2017-10-11 MED FILL — Medication: Qty: 1 | Status: AC

## 2017-10-24 ENCOUNTER — Encounter: Payer: Self-pay | Admitting: Dietician

## 2017-10-24 NOTE — Progress Notes (Signed)
Have not heard from patient to reschedule; sent letter to referring provider.

## 2018-06-02 ENCOUNTER — Other Ambulatory Visit: Payer: Self-pay | Admitting: Family Medicine

## 2018-06-02 DIAGNOSIS — M25562 Pain in left knee: Secondary | ICD-10-CM

## 2018-06-02 DIAGNOSIS — M79662 Pain in left lower leg: Principal | ICD-10-CM

## 2018-06-19 ENCOUNTER — Ambulatory Visit
Admission: RE | Admit: 2018-06-19 | Discharge: 2018-06-19 | Disposition: A | Discharge status: Home / Self Care | Payer: Medicare HMO | Source: Ambulatory Visit | Attending: Family Medicine | Admitting: Family Medicine

## 2018-06-19 DIAGNOSIS — X58XXXA Exposure to other specified factors, initial encounter: Secondary | ICD-10-CM | POA: Insufficient documentation

## 2018-06-19 DIAGNOSIS — M79662 Pain in left lower leg: Secondary | ICD-10-CM | POA: Insufficient documentation

## 2018-06-19 DIAGNOSIS — S83242A Other tear of medial meniscus, current injury, left knee, initial encounter: Secondary | ICD-10-CM | POA: Insufficient documentation

## 2018-06-19 DIAGNOSIS — M25562 Pain in left knee: Secondary | ICD-10-CM | POA: Diagnosis not present

## 2018-06-23 ENCOUNTER — Other Ambulatory Visit: Payer: Self-pay | Admitting: Family Medicine

## 2018-06-23 DIAGNOSIS — Z1231 Encounter for screening mammogram for malignant neoplasm of breast: Secondary | ICD-10-CM

## 2018-06-24 ENCOUNTER — Other Ambulatory Visit: Payer: Self-pay

## 2018-06-24 ENCOUNTER — Encounter: Payer: Self-pay | Admitting: *Deleted

## 2018-06-26 ENCOUNTER — Encounter
Admission: RE | Disposition: A | Discharge status: Home / Self Care | Payer: Self-pay | Source: Ambulatory Visit | Attending: Orthopedic Surgery

## 2018-06-26 ENCOUNTER — Ambulatory Visit
Admission: RE | Admit: 2018-06-26 | Discharge: 2018-06-26 | Disposition: A | Discharge status: Home / Self Care | Payer: Medicare HMO | Source: Ambulatory Visit | Attending: Orthopedic Surgery | Admitting: Orthopedic Surgery

## 2018-06-26 ENCOUNTER — Ambulatory Visit: Payer: Medicare HMO | Admitting: Anesthesiology

## 2018-06-26 DIAGNOSIS — M6751 Plica syndrome, right knee: Secondary | ICD-10-CM | POA: Diagnosis not present

## 2018-06-26 DIAGNOSIS — Z87891 Personal history of nicotine dependence: Secondary | ICD-10-CM | POA: Insufficient documentation

## 2018-06-26 DIAGNOSIS — Z888 Allergy status to other drugs, medicaments and biological substances status: Secondary | ICD-10-CM | POA: Diagnosis not present

## 2018-06-26 DIAGNOSIS — E785 Hyperlipidemia, unspecified: Secondary | ICD-10-CM | POA: Diagnosis not present

## 2018-06-26 DIAGNOSIS — M8580 Other specified disorders of bone density and structure, unspecified site: Secondary | ICD-10-CM | POA: Diagnosis not present

## 2018-06-26 DIAGNOSIS — Z8249 Family history of ischemic heart disease and other diseases of the circulatory system: Secondary | ICD-10-CM | POA: Insufficient documentation

## 2018-06-26 DIAGNOSIS — E039 Hypothyroidism, unspecified: Secondary | ICD-10-CM | POA: Insufficient documentation

## 2018-06-26 DIAGNOSIS — M23221 Derangement of posterior horn of medial meniscus due to old tear or injury, right knee: Secondary | ICD-10-CM | POA: Diagnosis present

## 2018-06-26 DIAGNOSIS — K219 Gastro-esophageal reflux disease without esophagitis: Secondary | ICD-10-CM | POA: Insufficient documentation

## 2018-06-26 DIAGNOSIS — Z79899 Other long term (current) drug therapy: Secondary | ICD-10-CM | POA: Diagnosis not present

## 2018-06-26 HISTORY — PX: KNEE ARTHROSCOPY: SHX127

## 2018-06-26 HISTORY — DX: Motion sickness, initial encounter: T75.3XXA

## 2018-06-26 SURGERY — ARTHROSCOPY, KNEE
Anesthesia: General | Site: Leg Lower | Laterality: Left

## 2018-06-26 MED ORDER — ONDANSETRON HCL 4 MG/2ML IJ SOLN: 4.0000 mg | Freq: Once | INTRAMUSCULAR | Status: DC | PRN

## 2018-06-26 MED ORDER — FENTANYL CITRATE (PF) 100 MCG/2ML IJ SOLN
INTRAMUSCULAR | Status: DC | PRN
  Administered 2018-06-26 (×2): 25 ug via INTRAVENOUS
  Administered 2018-06-26: 50 ug via INTRAVENOUS

## 2018-06-26 MED ORDER — ACETAMINOPHEN 500 MG PO TABS: 1000.0000 mg | ORAL_TABLET | Freq: Three times a day (TID) | ORAL | 2 refills | Status: AC

## 2018-06-26 MED ORDER — SCOPOLAMINE 1 MG/3DAYS TD PT72
1.0000 | MEDICATED_PATCH | Freq: Once | TRANSDERMAL | Status: DC
  Administered 2018-06-26: 1.5 mg via TRANSDERMAL

## 2018-06-26 MED ORDER — OXYCODONE HCL 5 MG/5ML PO SOLN: 5.0000 mg | Freq: Once | ORAL | Status: AC | PRN

## 2018-06-26 MED ORDER — HYDROCODONE-ACETAMINOPHEN 5-325 MG PO TABS: 1.0000 | ORAL_TABLET | ORAL | 0 refills | Status: DC | PRN

## 2018-06-26 MED ORDER — FENTANYL CITRATE (PF) 100 MCG/2ML IJ SOLN: 25.0000 ug | INTRAMUSCULAR | Status: DC | PRN

## 2018-06-26 MED ORDER — LACTATED RINGERS IV SOLN: INTRAVENOUS | Status: DC

## 2018-06-26 MED ORDER — IBUPROFEN 800 MG PO TABS: 800.0000 mg | ORAL_TABLET | Freq: Three times a day (TID) | ORAL | 0 refills | Status: AC

## 2018-06-26 MED ORDER — LACTATED RINGERS IV SOLN
1000.0000 mL | INTRAVENOUS | Status: DC
  Administered 2018-06-26: 1000 mL via INTRAVENOUS

## 2018-06-26 MED ORDER — LIDOCAINE-EPINEPHRINE 1 %-1:100000 IJ SOLN
INTRAMUSCULAR | Status: DC | PRN
  Administered 2018-06-26: 4 mL via INTRAMUSCULAR
  Administered 2018-06-26: 10 mL via INTRAMUSCULAR

## 2018-06-26 MED ORDER — MIDAZOLAM HCL 5 MG/5ML IJ SOLN
INTRAMUSCULAR | Status: DC | PRN
  Administered 2018-06-26: 2 mg via INTRAVENOUS

## 2018-06-26 MED ORDER — DEXTROSE 5 % IV SOLN
2000.0000 mg | Freq: Once | INTRAVENOUS | Status: AC
  Administered 2018-06-26: 2000 mg via INTRAVENOUS

## 2018-06-26 MED ORDER — ACETAMINOPHEN 10 MG/ML IV SOLN: 1000.0000 mg | Freq: Once | INTRAVENOUS | Status: DC | PRN

## 2018-06-26 MED ORDER — EPHEDRINE SULFATE 50 MG/ML IJ SOLN
INTRAMUSCULAR | Status: DC | PRN
  Administered 2018-06-26: 10 mg via INTRAVENOUS

## 2018-06-26 MED ORDER — DEXAMETHASONE SODIUM PHOSPHATE 4 MG/ML IJ SOLN
INTRAMUSCULAR | Status: DC | PRN
  Administered 2018-06-26: 4 mg via INTRAVENOUS

## 2018-06-26 MED ORDER — GLYCOPYRROLATE 0.2 MG/ML IJ SOLN
INTRAMUSCULAR | Status: DC | PRN
  Administered 2018-06-26: 0.1 mg via INTRAVENOUS

## 2018-06-26 MED ORDER — OXYCODONE HCL 5 MG PO TABS
5.0000 mg | ORAL_TABLET | Freq: Once | ORAL | Status: AC | PRN
  Administered 2018-06-26: 5 mg via ORAL

## 2018-06-26 MED ORDER — ONDANSETRON HCL 4 MG/2ML IJ SOLN
INTRAMUSCULAR | Status: DC | PRN
  Administered 2018-06-26: 4 mg via INTRAVENOUS

## 2018-06-26 MED ORDER — PROPOFOL 10 MG/ML IV BOLUS
INTRAVENOUS | Status: DC | PRN
  Administered 2018-06-26: 100 mg via INTRAVENOUS

## 2018-06-26 MED ORDER — ONDANSETRON 4 MG PO TBDP: 4.0000 mg | ORAL_TABLET | Freq: Three times a day (TID) | ORAL | 0 refills | Status: DC | PRN

## 2018-06-26 MED ORDER — LIDOCAINE HCL (CARDIAC) PF 100 MG/5ML IV SOSY
PREFILLED_SYRINGE | INTRAVENOUS | Status: DC | PRN
  Administered 2018-06-26: 40 mg via INTRATRACHEAL

## 2018-06-26 MED ORDER — ASPIRIN EC 325 MG PO TBEC: 325.0000 mg | DELAYED_RELEASE_TABLET | Freq: Every day | ORAL | 0 refills | Status: AC

## 2018-06-26 SURGICAL SUPPLY — 42 items
ADAPTER IRRIG TUBE 2 SPIKE SOL (ADAPTER) ×6 IMPLANT
BLADE SURG SZ11 CARB STEEL (BLADE) ×3 IMPLANT
BNDG COHESIVE 4X5 TAN STRL (GAUZE/BANDAGES/DRESSINGS) ×3 IMPLANT
BNDG ESMARK 6X12 TAN STRL LF (GAUZE/BANDAGES/DRESSINGS) ×2 IMPLANT
BUR RADIUS 3.5 (BURR) ×3 IMPLANT
CHLORAPREP W/TINT 26ML (MISCELLANEOUS) ×3 IMPLANT
COOLER POLAR GLACIER W/PUMP (MISCELLANEOUS) ×3 IMPLANT
COVER LIGHT HANDLE UNIVERSAL (MISCELLANEOUS) ×6 IMPLANT
CUFF TOURN SGL QUICK 30 (MISCELLANEOUS) ×2
CUFF TOURN SGL QUICK 34 (TOURNIQUET CUFF) ×2
CUFF TRNQT CYL 34X4X40X1 (TOURNIQUET CUFF) ×1 IMPLANT
CUFF TRNQT CYL LO 30X4X (MISCELLANEOUS) IMPLANT
DECANTER SPIKE VIAL GLASS SM (MISCELLANEOUS) ×3 IMPLANT
DRAPE IMP U-DRAPE 54X76 (DRAPES) ×3 IMPLANT
GAUZE SPONGE 4X4 12PLY STRL (GAUZE/BANDAGES/DRESSINGS) ×3 IMPLANT
GLOVE BIO SURGEON STRL SZ7.5 (GLOVE) ×3 IMPLANT
GLOVE BIOGEL PI IND STRL 8 (GLOVE) ×1 IMPLANT
GLOVE BIOGEL PI INDICATOR 8 (GLOVE) ×2
GOWN STRL REIN 2XL XLG LVL4 (GOWN DISPOSABLE) ×4 IMPLANT
GOWN STRL REUS W/ TWL LRG LVL3 (GOWN DISPOSABLE) IMPLANT
GOWN STRL REUS W/TWL LRG LVL3 (GOWN DISPOSABLE) ×2
IV LACTATED RINGER IRRG 3000ML (IV SOLUTION) ×8
IV LR IRRIG 3000ML ARTHROMATIC (IV SOLUTION) ×4 IMPLANT
KIT TURNOVER KIT A (KITS) ×3 IMPLANT
MAT ABSORB  FLUID 56X50 GRAY (MISCELLANEOUS) ×2
MAT ABSORB FLUID 56X50 GRAY (MISCELLANEOUS) ×1 IMPLANT
NDL HYPO 21X1.5 SAFETY (NEEDLE) ×1 IMPLANT
NEEDLE HYPO 21X1.5 SAFETY (NEEDLE) ×3 IMPLANT
NEPTUNE MANIFOLD (MISCELLANEOUS) ×3 IMPLANT
PACK ARTHROSCOPY KNEE (MISCELLANEOUS) ×3 IMPLANT
PAD ABD DERMACEA PRESS 5X9 (GAUZE/BANDAGES/DRESSINGS) ×6 IMPLANT
PAD WRAPON POLAR KNEE (MISCELLANEOUS) ×1 IMPLANT
PADDING CAST BLEND 6X4 STRL (MISCELLANEOUS) ×1 IMPLANT
PADDING STRL CAST 6IN (MISCELLANEOUS) ×2
SET TUBE SUCT SHAVER OUTFL 24K (TUBING) ×3 IMPLANT
SET TUBE TIP INTRA-ARTICULAR (MISCELLANEOUS) ×3 IMPLANT
SUT ETHILON 3-0 FS-10 30 BLK (SUTURE) ×3
SUTURE EHLN 3-0 FS-10 30 BLK (SUTURE) ×1 IMPLANT
TOWEL OR 17X26 4PK STRL BLUE (TOWEL DISPOSABLE) ×6 IMPLANT
TUBING ARTHRO INFLOW-ONLY STRL (TUBING) ×3 IMPLANT
WAND HAND CNTRL MULTIVAC 90 (MISCELLANEOUS) ×3 IMPLANT
WRAPON POLAR PAD KNEE (MISCELLANEOUS) ×3

## 2018-06-26 NOTE — Op Note (Signed)
Operative Note    SURGERY DATE: 06/26/2018   PRE-OP DIAGNOSIS:  1. Right medial meniscus tear   POST-OP DIAGNOSIS:  1. Right medial meniscus tear 2. Right trochlea degenerative changes 3. Right medial plica band  PROCEDURES:  1. Right knee arthroscopy, partial medial meniscectomy 2. Right knee chondroplasty of patellofemoral compartment 3. Right knee excision of medial plica band   SURGEON: Rosealee Albee, MD   ANESTHESIA: Gen   ESTIMATED BLOOD LOSS: minimal   TOTAL IV FLUIDS: per anesthesia   INDICATION(S):  Gray Maugeri is a 72 y.o. female with signs and symptoms as well as MRI finding of medial meniscus tear. She has undergone a trial of activity modification and NSAIDs for > 1 year without adequate relief of symptoms. After discussion of risks, benefits, and alternatives to surgery, the patient elected to proceed.   OPERATIVE FINDINGS:    Examination under anesthesia: A careful examination under anesthesia was performed.  Passive range of motion was: Hyperextension: 2.  Extension: 0.  Flexion: 140.  Lachman: normal. Pivot Shift: normal.  Posterior drawer: normal.  Varus stability in full extension: normal.  Varus stability in 30 degrees of flexion: normal.  Valgus stability in full extension: normal.  Valgus stability in 30 degrees of flexion: normal.   Intra-operative findings: A thorough arthroscopic examination of the knee was performed.  The findings are: 1. Suprapatellar pouch: Mildly diminished volume due to tissue band 2. Undersurface of median ridge: Normal 3. Medial patellar facet: Normal 4. Lateral patellar facet: Normal 5. Trochlea: Focal area of Grade 3 degenerative changes centrally 6. Lateral gutter/popliteus tendon: Normal 7. Hoffa's fat pad: Inflamed 8. Medial gutter/plica: Medial plica band present 9. ACL: Normal 10. PCL: Normal 11. Medial meniscus: Complex tear with both radial tears of the posterior horn affecting ~40% of meniscus width.  Additionally, there is horizontal undersurface tearing of posterior horn from the body to posterior root.  12. Medial compartment cartilage: Grade 1 degenerative changes to MFC and tibial plateau 13. Lateral meniscus: Normal 14. Lateral compartment cartilage: Grade 1 degenerative changes to tibial plateau   OPERATIVE REPORT:     I identified Annell Canty in the pre-operative holding area. I marked the operative knee with my initials. I reviewed the risks and benefits of the proposed surgical intervention and the patient (and/or patient's guardian) wished to proceed. The patient was transferred to the operative suite and placed in the supine position with all bony prominences padded.  Anesthesia was administered. Appropriate IV antibiotics were administered prior to incision. The extremity was then prepped and draped in standard fashion. A time out was performed confirming the correct extremity, correct patient, and correct procedure.   Arthroscopy portals were marked. Local anesthetic was injected to the planned portal sites. The anterolateral portal was established with an 11 blade.      The arthroscope was placed in the anterolateral portal and then into the suprapatellar pouch.  A diagnostic knee scope was completed with the above findings. The medial meniscus tear was identified.   Next the medial portal was established under needle localization. The MCL was pie-crusted to improve visualization of the posterior horn. The meniscal tear was debrided using arthroscopic biters and an oscillating shaver until the meniscus had stable borders. Approximately 6mm of posterior horn of the meniscus remained post-debridement. A chondroplasty was performed of the trochlea in the patellofemoral compartment such that there were stable cartilage edges without any loose fragments of cartilage. The medial plica band was excised using a combination of  the Arthrocare wand and shaver. Arthroscopic fluid was removed  from the joint.   The portals were closed with 3-0 Nylon suture. Sterile dressings included Xeroform, 4x4s, Sof-Rol, and Bias wrap. A Polarcare was placed.  The patient was then awakened and taken to the PACU hemodynamically stable without complication.     POSTOPERATIVE PLAN: The patient will be discharged home today once they meet PACU criteria. Aspirin 325 mg daily was prescribed for 2 weeks for DVT prophylaxis.  Physical therapy will start on POD#3-4. Weight-bearing as tolerated. Follow up in 2 weeks per protocol.

## 2018-06-26 NOTE — Anesthesia Preprocedure Evaluation (Signed)
Anesthesia Evaluation  Patient identified by MRN, date of birth, ID band Patient awake    Reviewed: Allergy & Precautions, NPO status , Patient's Chart, lab work & pertinent test results  History of Anesthesia Complications (+) DIFFICULT IV STICK / SPECIAL LINE and history of anesthetic complications  Airway Mallampati: III  TM Distance: >3 FB Neck ROM: Full    Dental no notable dental hx.    Pulmonary former smoker (quit 2000),    Pulmonary exam normal breath sounds clear to auscultation       Cardiovascular Exercise Tolerance: Good negative cardio ROS Normal cardiovascular exam Rhythm:Regular Rate:Normal     Neuro/Psych negative neurological ROS     GI/Hepatic GERD  ,  Endo/Other  Hypothyroidism   Renal/GU negative Renal ROS     Musculoskeletal   Abdominal   Peds  Hematology negative hematology ROS (+)   Anesthesia Other Findings   Reproductive/Obstetrics                             Anesthesia Physical Anesthesia Plan  ASA: II  Anesthesia Plan: General   Post-op Pain Management:    Induction: Intravenous  PONV Risk Score and Plan: 3 and Ondansetron, Dexamethasone and Scopolamine patch - Pre-op  Airway Management Planned: LMA  Additional Equipment:   Intra-op Plan:   Post-operative Plan: Extubation in OR  Informed Consent: I have reviewed the patients History and Physical, chart, labs and discussed the procedure including the risks, benefits and alternatives for the proposed anesthesia with the patient or authorized representative who has indicated his/her understanding and acceptance.     Plan Discussed with: CRNA  Anesthesia Plan Comments:         Anesthesia Quick Evaluation

## 2018-06-26 NOTE — Transfer of Care (Signed)
Immediate Anesthesia Transfer of Care Note  Patient: Jennifer Farmer  Procedure(s) Performed: ARTHROSCOPY KNEE WITH PARTIAL MEDIAL MENISECTOMY (Left Leg Lower)  Patient Location: PACU  Anesthesia Type: General  Level of Consciousness: awake, alert  and patient cooperative  Airway and Oxygen Therapy: Patient Spontanous Breathing and Patient connected to supplemental oxygen  Post-op Assessment: Post-op Vital signs reviewed, Patient's Cardiovascular Status Stable, Respiratory Function Stable, Patent Airway and No signs of Nausea or vomiting  Post-op Vital Signs: Reviewed and stable  Complications: No apparent anesthesia complications

## 2018-06-26 NOTE — Anesthesia Procedure Notes (Signed)
Procedure Name: LMA Insertion Date/Time: 06/26/2018 7:03 AM Performed by: Jimmy Picket, CRNA Pre-anesthesia Checklist: Patient identified, Emergency Drugs available, Suction available, Timeout performed and Patient being monitored Patient Re-evaluated:Patient Re-evaluated prior to induction Oxygen Delivery Method: Circle system utilized Preoxygenation: Pre-oxygenation with 100% oxygen Induction Type: IV induction LMA: LMA inserted LMA Size: 4.0 Number of attempts: 1 Placement Confirmation: positive ETCO2 and breath sounds checked- equal and bilateral Tube secured with: Tape

## 2018-06-26 NOTE — Anesthesia Postprocedure Evaluation (Signed)
Anesthesia Post Note  Patient: Jennifer Farmer  Procedure(s) Performed: ARTHROSCOPY KNEE WITH PARTIAL MEDIAL MENISECTOMY (Left Leg Lower)  Patient location during evaluation: PACU Anesthesia Type: General Level of consciousness: awake and alert, oriented and patient cooperative Pain management: pain level controlled Vital Signs Assessment: post-procedure vital signs reviewed and stable Respiratory status: spontaneous breathing, nonlabored ventilation and respiratory function stable Cardiovascular status: blood pressure returned to baseline and stable Postop Assessment: adequate PO intake Anesthetic complications: no    Reed Breech

## 2018-06-26 NOTE — Discharge Instructions (Signed)
Arthroscopic Knee Surgery - Partial Meniscectomy °  °Post-Op Instructions °  °1. Bracing or crutches: Crutches will be provided at the time of discharge from the surgery center if you do not already have them. °  °2. Ice: You may be provided with a device (Polar Care) that allows you to ice the affected area effectively. Otherwise you can ice manually.  °  °3. Driving:  Plan on not driving for at least two weeks. Please note that you are advised NOT to drive while taking narcotic pain medications as you may be impaired and unsafe to drive. °  °4. Activity: Ankle pumps several times an hour while awake to prevent blood clots. Weight bearing: as tolerated. Use crutches for as needed (usually ~1 week or less) until pain allows you to ambulate without a limp. Bending and straightening the knee is unlimited. Elevate knee above heart level as much as possible for one week. Avoid standing more than 5 minutes (consecutively) for the first week.  Avoid long distance travel for 2 weeks. ° °5. Medications:  °- You have been provided a prescription for narcotic pain medicine. After surgery, take 1-2 narcotic tablets every 4 hours if needed for severe pain.  °- You may take up to 3000mg/day of tylenol (acetaminophen). You can take 1000mg 3x/day. Please check your narcotic. If you have acetaminophen in your narcotic (each tablet will be 325mg), be careful not to exceed a total of 3000mg/day of acetaminophen.  °- A prescription for anti-nausea medication will be provided in case the narcotic medicine or anesthesia causes nausea - take 1 tablet every 6 hours only if nauseated.  °- Take ibuprofen 800 mg every 8 hours WITH food to reduce post-operative knee swelling. DO NOT STOP IBUPROFEN POST-OP UNTIL INSTRUCTED TO DO SO at first post-op office visit (10-14 days after surgery). However, please discontinue if you have any abdominal discomfort after taking this.  °- Take enteric coated aspirin 325 mg once daily for 2 weeks to prevent  blood clots.  ° ° °6. Bandages: The physical therapist should change the bandages at the first post-op appointment. If needed, the dressing supplies have been provided to you. °  °7. Physical Therapy: 1-2 times per week for 6 weeks. Therapy typically starts on post operative Day 3 or 4. You have been provided an order for physical therapy. The therapist will provide home exercises. °  °8. Work: May return to full work usually around 2 weeks after 1st post-operative visit. May do light duty/desk job in approximately 1-2 weeks when off of narcotics, pain is well-controlled, and swelling has decreased. Labor intensive jobs may require 4-6 weeks to return.  °  °  °9. Post-Op Appointments: °Your first post-op appointment will be with Dr. Yoneko Talerico in approximately 2 weeks time.  °  ° °If you find that they have not been scheduled please call the [facility] front desk at [phone]. ° ° ° °General Anesthesia, Adult, Care After °These instructions provide you with information about caring for yourself after your procedure. Your health care provider may also give you more specific instructions. Your treatment has been planned according to current medical practices, but problems sometimes occur. Call your health care provider if you have any problems or questions after your procedure. °What can I expect after the procedure? °After the procedure, it is common to have: °· Vomiting. °· A sore throat. °· Mental slowness. ° °It is common to feel: °· Nauseous. °· Cold or shivery. °· Sleepy. °· Tired. °·   Sore or achy, even in parts of your body where you did not have surgery. ° °Follow these instructions at home: °For at least 24 hours after the procedure: °· Do not: °? Participate in activities where you could fall or become injured. °? Drive. °? Use heavy machinery. °? Drink alcohol. °? Take sleeping pills or medicines that cause drowsiness. °? Make important decisions or sign legal documents. °? Take care of children  on your own. °· Rest. °Eating and drinking °· If you vomit, drink water, juice, or soup when you can drink without vomiting. °· Drink enough fluid to keep your urine clear or pale yellow. °· Make sure you have little or no nausea before eating solid foods. °· Follow the diet recommended by your health care provider. °General instructions °· Have a responsible adult stay with you until you are awake and alert. °· Return to your normal activities as told by your health care provider. Ask your health care provider what activities are safe for you. °· Take over-the-counter and prescription medicines only as told by your health care provider. °· If you smoke, do not smoke without supervision. °· Keep all follow-up visits as told by your health care provider. This is important. °Contact a health care provider if: °· You continue to have nausea or vomiting at home, and medicines are not helpful. °· You cannot drink fluids or start eating again. °· You cannot urinate after 8-12 hours. °· You develop a skin rash. °· You have fever. °· You have increasing redness at the site of your procedure. °Get help right away if: °· You have difficulty breathing. °· You have chest pain. °· You have unexpected bleeding. °· You feel that you are having a life-threatening or urgent problem. °This information is not intended to replace advice given to you by your health care provider. Make sure you discuss any questions you have with your health care provider. °Document Released: 11/26/2000 Document Revised: 01/23/2016 Document Reviewed: 08/04/2015 °[employer] Interactive Patient Education © 2018 [employer] ° °

## 2018-06-26 NOTE — H&P (Signed)
Paper H&P to be scanned into permanent record. H&P reviewed. No significant changes noted.  

## 2018-07-16 ENCOUNTER — Ambulatory Visit
Admission: RE | Admit: 2018-07-16 | Discharge: 2018-07-16 | Disposition: A | Discharge status: Home / Self Care | Payer: Medicare HMO | Source: Ambulatory Visit | Attending: Family Medicine | Admitting: Family Medicine

## 2018-07-16 DIAGNOSIS — Z1231 Encounter for screening mammogram for malignant neoplasm of breast: Secondary | ICD-10-CM | POA: Diagnosis present

## 2018-11-21 ENCOUNTER — Other Ambulatory Visit: Payer: Self-pay | Admitting: Gastroenterology

## 2018-11-21 DIAGNOSIS — R1310 Dysphagia, unspecified: Secondary | ICD-10-CM

## 2018-12-18 ENCOUNTER — Ambulatory Visit: Admission: RE | Admit: 2018-12-18 | Payer: Medicare HMO | Source: Ambulatory Visit

## 2018-12-19 ENCOUNTER — Other Ambulatory Visit: Payer: Self-pay | Admitting: Gastroenterology

## 2018-12-19 DIAGNOSIS — R1310 Dysphagia, unspecified: Secondary | ICD-10-CM

## 2019-02-05 ENCOUNTER — Ambulatory Visit
Admission: RE | Admit: 2019-02-05 | Discharge: 2019-02-05 | Disposition: A | Discharge status: Home / Self Care | Payer: Medicare HMO | Source: Ambulatory Visit | Attending: Gastroenterology | Admitting: Gastroenterology

## 2019-02-05 ENCOUNTER — Other Ambulatory Visit: Payer: Self-pay

## 2019-02-05 DIAGNOSIS — R1310 Dysphagia, unspecified: Secondary | ICD-10-CM | POA: Insufficient documentation

## 2019-02-05 NOTE — Therapy (Addendum)
[facility] DIAGNOSTIC RADIOLOGY [address] [place], [place], [address] Phone: [phone]   Fax:     Modified Barium Swallow  Patient Details  Name: Jennifer Farmer MRN: [id number] Date of Birth: December 10, 1945 No data recorded  Encounter Date: 02/05/2019  End of Session - 02/05/19 1458    Visit Number  1    Number of Visits  1    Date for SLP Re-Evaluation  02/05/19    SLP Start Time  1300    SLP Stop Time   1400    SLP Time Calculation (min)  60 min    Activity Tolerance  Patient tolerated treatment well       Past Medical History:  Diagnosis Date  . Edema    FEET/LEGS  . Enlarged thyroid   . GERD (gastroesophageal reflux disease)   . Hyperlipidemia   . Hypothyroidism   . Motion sickness    boats  . Obesity   . Obesity   . Osteopenia   . Palpitations   . Pure hypercholesterolemia with target low density lipoprotein (LDL) cholesterol less than 130 mg/dL     Past Surgical History:  Procedure Laterality Date  . ABDOMINAL HYSTERECTOMY    . CATARACT EXTRACTION W/PHACO Right 09/19/2017   Procedure: CATARACT EXTRACTION PHACO AND INTRAOCULAR LENS PLACEMENT (IOC);  Surgeon: Nevada Crane, MD;  Location: [facility] ORS;  Service: Ophthalmology;  Laterality: Right;  [place] 00:17.1 AP% 8.7 CDE 1.49 Fluid Pack Lot # [id number]  . CATARACT EXTRACTION W/PHACO Left 10/10/2017   Procedure: CATARACT EXTRACTION PHACO AND INTRAOCULAR LENS PLACEMENT (IOC);  Surgeon: Nevada Crane, MD;  Location: [facility] ORS;  Service: Ophthalmology;  Laterality: Left;  fluid pack lot # [id number]  exp10/31/2020 [place]   00:20.5 AP%   10.7 CDE    2.9  . CESAREAN SECTION    . COLONOSCOPY WITH PROPOFOL N/A 11/28/2016   Procedure: COLONOSCOPY WITH PROPOFOL;  Surgeon: Scot Jun, MD;  Location: [facility] ENDOSCOPY;  Service: Endoscopy;  Laterality: N/A;  . KNEE ARTHROSCOPY Left 06/26/2018   Procedure: ARTHROSCOPY KNEE WITH PARTIAL MEDIAL MENISECTOMY;  Surgeon: Signa Kell, MD;   Location: [facility];  Service: Orthopedics;  Laterality: Left;    There were no vitals filed for this visit.      Subjective: Patient behavior: (alertness, ability to follow instructions, etc.): pt has a baseline GI h/o Gastroparesis; GERD on Nexium 2x daily per GI. Of note, pt had been on Reglan previously for the Gastroparesis. She reports she feels "Full" easily w/ "some" meals and has a cough during the night; post meals. PMH: enlarged thyroid, hyperlipidemia, obesity, osteopenia. Pt denies any change in her eating habits; or weight loss d/t difficulty swallowing. No Neurological history noted. OM exam: WLF; Native dentition.  Chief complaint: dysphagia   Objective:  Radiological Procedure: A videoflouroscopic evaluation of oral-preparatory, reflex initiation, and pharyngeal phases of the swallow was performed; as well as a screening of the upper esophageal phase.  I. POSTURE: upright II. VIEW: lateral III. COMPENSATORY STRATEGIES: none indicated  IV. BOLUSES ADMINISTERED:  Thin Liquid: 5 trials  Nectar-thick Liquid: 2 trials  Honey-thick Liquid: NT  Puree: 3 trials  Mechanical Soft: 2 trials V. RESULTS OF EVALUATION: A. ORAL PREPARATORY PHASE: (The lips, tongue, and velum are observed for strength and coordination)       **Overall Severity Rating: WFL. Pt exhibited timely bolus management for bolus manipulation and/or mastication then timely A-P transfer for swallowing. Oral clearing achieved fully b/t each  trial w/ no deficits noted.   B. SWALLOW INITIATION/REFLEX: (The reflex is normal if "triggered" by the time the bolus reached the base of the tongue)  **Overall Severity Rating: grossly WFL. The pharyngeal swallow appeared to trigger at approximately the level of the Valleculae w/ the majority of consistencies/trials during this exam. Noted Larger sips/volume of thin liquids appeared to spill from the Valleculae. Adequate airway closure was achieved during the  swallowing of trial consistencies.    C. PHARYNGEAL PHASE: (Pharyngeal function is normal if the bolus shows rapid, smooth, and continuous transit through the pharynx and there is no pharyngeal residue after the swallow)  **Overall Severity Rating: [facility]. No prominent pharyngeal residue remained post trials/consistencies indicating adequate laryngeal excursion and pharyngeal pressure during the swallowing.   D. LARYNGEAL PENETRATION: (Material entering into the laryngeal inlet/vestibule but not aspirated): None  E. ASPIRATION: None F. ESOPHAGEAL PHASE: (Screening of the upper esophagus): There appeared to be a MILD narrowing from BOTH the anterior and posterior sides of the Esophagus at ~C5-6 during the parastalsis - it did not impede the bolus consistencies assessed during this evaluation. However, there could be impact on motility w/ increased textured foods such as meats/breads. This, as well as pt's baseline GERD and Gastroparesis, could be reasons for pt's reported discomfort and cough. Due to the nature of Esophageal narrowing and its impact on bolus motility/clearing, recommend f/u w/ GI for further assessment.   ASSESSMENT: Pt appeared to present w/ No oropharyngeal phase dysphagia during this exam today; however, noted Esophageal phase deficit as well as noted pt's PMH of GERD and Gastroparesis. ANY Esophageal phase deficits/dysmotility can impact function of the Esophagus and cause discomfort, cough which pt has c/o. Due to the noted Esophageal phase deficit of narrowing from BOTH the anterior/posterior sides at ~C5-6 during parastalsis(though it did not impede bolus flow w/ trials given), recommend further assessment w/ GI for management.  During the po trials, pt exhibited timely oral bolus management for bolus manipulation and/or mastication then timely A-P transfer for swallowing. Oral clearing achieved fully b/t each trial w/ no deficits noted. During the pharyngeal phase, the pharyngeal  swallow appeared to trigger at approximately the level of the Valleculae w/ the majority of consistencies/trials during this exam; noted Larger sips/volume of thin liquids appeared to spill from the Valleculae as the swallow triggered. Adequate airway closure was achieved during the swallowing of trial consistencies. No prominent pharyngeal residue remained post trials/consistencies indicating adequate laryngeal excursion and pharyngeal pressure during the swallowing.   PLAN/RECOMMENDATIONS:  A. Diet: Regular diet w/ Thin liquids; cut foods Small; moisten well  B. Swallowing Precautions: Reflux and aspiration precautions  C. Recommended consultation to continue f/u w/ GI for management AND further assessment of Upper Esophageal narrowing during the swallow  D. Therapy recommendations: None  E. Results and recommendations were discussed w/ Pt; video viewed and questions answered          Dysphagia, unspecified type - Plan: DG SWALLOW FUNC OP MEDICARE SPEECH PATH, DG SWALLOW FUNC OP MEDICARE SPEECH PATH        Problem List Patient Active Problem List   Diagnosis Date Noted  . Gastroesophageal reflux disease without esophagitis 07/04/2017  . Obesity (BMI 30.0-34.9) 07/04/2017  . Pure hypercholesterolemia 07/04/2017  . Osteopenia 07/04/2017      Jerilynn Som, MS, CCC-SLP Watson,Katherine 02/05/2019, 2:59 PM  [facility] DIAGNOSTIC RADIOLOGY [address] [place], [place], [address] Phone: [phone]   Fax:  Name: Jennifer Farmer MRN: [id number] Date of Birth: 10-Mar-1946

## 2019-02-27 ENCOUNTER — Other Ambulatory Visit: Payer: Self-pay

## 2019-02-27 ENCOUNTER — Encounter
Admission: RE | Admit: 2019-02-27 | Discharge: 2019-02-27 | Disposition: A | Discharge status: Home / Self Care | Payer: Medicare HMO | Source: Ambulatory Visit | Attending: Internal Medicine | Admitting: Internal Medicine

## 2019-02-27 DIAGNOSIS — Z1159 Encounter for screening for other viral diseases: Secondary | ICD-10-CM | POA: Diagnosis present

## 2019-02-28 LAB — NOVEL CORONAVIRUS, NAA (HOSP ORDER, SEND-OUT TO REF LAB; TAT 18-24 HRS): SARS-CoV-2, NAA: NOT DETECTED

## 2019-03-03 ENCOUNTER — Encounter: Payer: Self-pay | Admitting: *Deleted

## 2019-03-04 ENCOUNTER — Ambulatory Visit: Payer: Medicare HMO | Admitting: Anesthesiology

## 2019-03-04 ENCOUNTER — Encounter
Admission: RE | Disposition: A | Discharge status: Home / Self Care | Payer: Self-pay | Source: Home / Self Care | Attending: Internal Medicine

## 2019-03-04 ENCOUNTER — Encounter: Payer: Self-pay | Admitting: *Deleted

## 2019-03-04 ENCOUNTER — Ambulatory Visit
Admission: RE | Admit: 2019-03-04 | Discharge: 2019-03-04 | Disposition: A | Discharge status: Home / Self Care | Payer: Medicare HMO | Attending: Internal Medicine | Admitting: Internal Medicine

## 2019-03-04 ENCOUNTER — Other Ambulatory Visit: Payer: Self-pay

## 2019-03-04 DIAGNOSIS — J45909 Unspecified asthma, uncomplicated: Secondary | ICD-10-CM | POA: Diagnosis not present

## 2019-03-04 DIAGNOSIS — M8580 Other specified disorders of bone density and structure, unspecified site: Secondary | ICD-10-CM | POA: Diagnosis not present

## 2019-03-04 DIAGNOSIS — Z87891 Personal history of nicotine dependence: Secondary | ICD-10-CM | POA: Diagnosis not present

## 2019-03-04 DIAGNOSIS — R933 Abnormal findings on diagnostic imaging of other parts of digestive tract: Secondary | ICD-10-CM | POA: Insufficient documentation

## 2019-03-04 DIAGNOSIS — E785 Hyperlipidemia, unspecified: Secondary | ICD-10-CM | POA: Insufficient documentation

## 2019-03-04 DIAGNOSIS — E669 Obesity, unspecified: Secondary | ICD-10-CM | POA: Insufficient documentation

## 2019-03-04 DIAGNOSIS — K2970 Gastritis, unspecified, without bleeding: Secondary | ICD-10-CM | POA: Insufficient documentation

## 2019-03-04 DIAGNOSIS — Z79899 Other long term (current) drug therapy: Secondary | ICD-10-CM | POA: Insufficient documentation

## 2019-03-04 DIAGNOSIS — K219 Gastro-esophageal reflux disease without esophagitis: Secondary | ICD-10-CM | POA: Diagnosis not present

## 2019-03-04 DIAGNOSIS — Z950 Presence of cardiac pacemaker: Secondary | ICD-10-CM | POA: Diagnosis not present

## 2019-03-04 DIAGNOSIS — E7800 Pure hypercholesterolemia, unspecified: Secondary | ICD-10-CM | POA: Diagnosis not present

## 2019-03-04 DIAGNOSIS — R1310 Dysphagia, unspecified: Secondary | ICD-10-CM | POA: Insufficient documentation

## 2019-03-04 DIAGNOSIS — K449 Diaphragmatic hernia without obstruction or gangrene: Secondary | ICD-10-CM | POA: Insufficient documentation

## 2019-03-04 HISTORY — PX: ESOPHAGOGASTRODUODENOSCOPY (EGD) WITH PROPOFOL: SHX5813

## 2019-03-04 SURGERY — ESOPHAGOGASTRODUODENOSCOPY (EGD) WITH PROPOFOL
Anesthesia: General

## 2019-03-04 MED ORDER — PROPOFOL 10 MG/ML IV BOLUS
INTRAVENOUS | Status: DC | PRN
  Administered 2019-03-04: 80 mg via INTRAVENOUS
  Administered 2019-03-04: 20 mg via INTRAVENOUS

## 2019-03-04 MED ORDER — PROPOFOL 10 MG/ML IV BOLUS
INTRAVENOUS | Status: AC
  Filled 2019-03-04: qty 20

## 2019-03-04 MED ORDER — SODIUM CHLORIDE 0.9 % IV SOLN
INTRAVENOUS | Status: DC
  Administered 2019-03-04: 1000 mL via INTRAVENOUS

## 2019-03-04 NOTE — H&P (Signed)
Outpatient short stay form Pre-procedure 03/04/2019 8:05 AM Teodoro K. Alice Reichert, M.D.  Primary Physician: Meriel Flavors, M.D.  Reason for visit:  Dysphagia, abnormal swallowing study xray  History of present illness:  73 y/o female presents with intermittent dysphagia and a MBSS showing "narrowing". Functional status was seen without aspiration, however.     Current Facility-Administered Medications:  .  0.9 %  sodium chloride infusion, , Intravenous, Continuous, [place], Benay Pike, MD, Last Rate: 20 mL/hr at 03/04/19 0748, 1,000 mL at 03/04/19 0748  Medications Prior to Admission  Medication Sig Dispense Refill Last Dose  . albuterol (VENTOLIN HFA) 108 (90 Base) MCG/ACT inhaler Inhale 2 puffs into the lungs every 4 (four) hours as needed for wheezing or shortness of breath.   Past Week at Unknown time  . Calcium Carbonate-Vitamin D (CALTRATE 600+D) 600-400 MG-UNIT tablet Take 1 tablet by mouth 2 (two) times daily.    Past Week at Unknown time  . esomeprazole (NEXIUM) 40 MG capsule Take 40 mg by mouth daily before supper.    03/03/2019 at Unknown time  . Multiple Vitamins-Minerals (CENTRUM SILVER 50+WOMEN PO) Take 1 tablet by mouth daily.    Past Week at Unknown time  . ondansetron (ZOFRAN ODT) 4 MG disintegrating tablet Take 1 tablet (4 mg total) by mouth every 8 (eight) hours as needed for nausea or vomiting. 20 tablet 0 Past Week at Unknown time  . simvastatin (ZOCOR) 20 MG tablet Take 20 mg by mouth daily at 6 PM.    03/03/2019 at Unknown time  . Zinc Acetate 25 MG CAPS Take 25 mg by mouth daily.     Marland Kitchen acetaminophen (TYLENOL) 500 MG tablet Take 2 tablets (1,000 mg total) by mouth every 8 (eight) hours. 90 tablet 2   . HYDROcodone-acetaminophen (NORCO) 5-325 MG tablet Take 1-2 tablets by mouth every 4 (four) hours as needed for moderate pain or severe pain. (Patient not taking: Reported on 03/04/2019) 15 tablet 0 Completed Course at Unknown time  . HYDROCODONE-CHLORPHENIRAMINE PO Take by  mouth as needed.     . loratadine (CLARITIN) 10 MG tablet Take 10 mg by mouth daily.   Completed Course at Unknown time  . Melatonin 5 MG TABS Take 5 mg by mouth at bedtime.     . Probiotic Product (PROBIOTIC PO) Take 1 tablet by mouth daily.     . ranitidine (ZANTAC) 150 MG tablet Take 150 mg by mouth daily as needed for heartburn.         Allergies  Allergen Reactions  . Adhesive [Tape] Itching    Redness      Past Medical History:  Diagnosis Date  . Edema    FEET/LEGS  . Enlarged thyroid   . GERD (gastroesophageal reflux disease)   . Hyperlipidemia   . Hypothyroidism   . Motion sickness    boats  . Obesity   . Obesity   . Osteopenia   . Palpitations   . Pure hypercholesterolemia with target low density lipoprotein (LDL) cholesterol less than 130 mg/dL     Review of systems:  Otherwise negative.    Physical Exam  Gen: Alert, oriented. Appears stated age.  HEENT: [place]/AT. PERRLA. Lungs: CTA, no wheezes. CV: RR nl S1, S2. Abd: soft, benign, no masses. BS+ Ext: No edema. Pulses 2+    Planned procedures: Proceed with EGD. The patient understands the nature of the planned procedure, indications, risks, alternatives and potential complications including but not limited to bleeding, infection, perforation, damage to internal  organs and possible oversedation/side effects from anesthesia. The patient agrees and gives consent to proceed.  Please refer to procedure notes for findings, recommendations and patient disposition/instructions.     Teodoro K. Norma Fredricksonoledo, M.D. Gastroenterology 03/04/2019  8:05 AM

## 2019-03-04 NOTE — Anesthesia Post-op Follow-up Note (Signed)
Anesthesia QCDR form completed.        

## 2019-03-04 NOTE — Anesthesia Postprocedure Evaluation (Signed)
Anesthesia Post Note  Patient: Jennifer Farmer  Procedure(s) Performed: ESOPHAGOGASTRODUODENOSCOPY (EGD) WITH PROPOFOL (N/A )  Patient location during evaluation: Endoscopy Anesthesia Type: General Level of consciousness: awake and alert Pain management: pain level controlled Vital Signs Assessment: post-procedure vital signs reviewed and stable Respiratory status: spontaneous breathing and respiratory function stable Cardiovascular status: stable Anesthetic complications: no     Last Vitals:  Vitals:   03/04/19 0820 03/04/19 0830  BP: (!) 101/48 (!) 89/63  Pulse: 69 70  Resp: 15 18  Temp: (!) 36 C   SpO2: 95% 96%    Last Pain:  Vitals:   03/04/19 0820  TempSrc: Tympanic                 KEPHART,WILLIAM K

## 2019-03-04 NOTE — Transfer of Care (Signed)
Immediate Anesthesia Transfer of Care Note  Patient: Jennifer Farmer  Procedure(s) Performed: ESOPHAGOGASTRODUODENOSCOPY (EGD) WITH PROPOFOL (N/A )  Patient Location: Endoscopy Unit  Anesthesia Type:General  Level of Consciousness: drowsy and patient cooperative  Airway & Oxygen Therapy: Patient Spontanous Breathing and Patient connected to nasal cannula oxygen  Post-op Assessment: Report given to RN and Post -op Vital signs reviewed and stable  Post vital signs: Reviewed and stable  Last Vitals:  Vitals Value Taken Time  BP 101/48 03/04/19 0823  Temp 36 C 03/04/19 0820  Pulse 71 03/04/19 0824  Resp 19 03/04/19 0824  SpO2 96 % 03/04/19 0824  Vitals shown include unvalidated device data.  Last Pain:  Vitals:   03/04/19 0820  TempSrc: Tympanic         Complications: No apparent anesthesia complications

## 2019-03-04 NOTE — Interval H&P Note (Signed)
History and Physical Interval Note:  03/04/2019 8:11 AM  Jennifer Farmer  has presented today for surgery, with the diagnosis of DYSPHAGIA,ABNORMAL BA.SWALLOW.  The various methods of treatment have been discussed with the patient and family. After consideration of risks, benefits and other options for treatment, the patient has consented to  Procedure(s): ESOPHAGOGASTRODUODENOSCOPY (EGD) WITH PROPOFOL (N/A) as a surgical intervention.  The patient's history has been reviewed, patient examined, no change in status, stable for surgery.  I have reviewed the patient's chart and labs.  Questions were answered to the patient's satisfaction.     [place], [place]

## 2019-03-04 NOTE — Op Note (Signed)
[facility] Gastroenterology Patient Name: Jennifer Farmer Procedure Date: 03/04/2019 7:19 AM MRN: [id number] Account #: [id number] Date of Birth: 12-Jul-1946 Admit Type: Outpatient Age: 73 Room: [facility] ENDO ROOM 2 Gender: Female Note Status: Finalized Procedure:            Upper GI endoscopy Indications:          Dysphagia, Abnormal cine-esophagram Providers:            Boykin Nearingeodoro K. Kj Imbert MD, MD Medicines:            Propofol per Anesthesia Complications:        No immediate complications. Procedure:            Pre-Anesthesia Assessment:                       - The risks and benefits of the procedure and the                        sedation options and risks were discussed with the                        patient. All questions were answered and informed                        consent was obtained.                       - Patient identification and proposed procedure were                        verified prior to the procedure by the nurse. The                        procedure was verified in the procedure room.                       - ASA Grade Assessment: III - A patient with severe                        systemic disease.                       - After reviewing the risks and benefits, the patient                        was deemed in satisfactory condition to undergo the                        procedure.                       After obtaining informed consent, the endoscope was                        passed under direct vision. Throughout the procedure,                        the patient's blood pressure, pulse, and oxygen                        saturations were monitored continuously. The Endoscope  was introduced through the mouth, and advanced to the                        third part of duodenum. The upper GI endoscopy was                        accomplished without difficulty. The patient tolerated                        the procedure  well. Findings:      No endoscopic abnormality was evident in the esophagus to explain the       patient's complaint of dysphagia.      No gross lesions were noted in the entire esophagus.      Localized minimal inflammation characterized by erythema was found in       the gastric antrum. Biopsies were taken with a cold forceps for       histology.      A 1 cm hiatal hernia was present.      The examined duodenum was normal. Impression:           - No endoscopic esophageal abnormality to explain                        patient's dysphagia.                       - No gross lesions in esophagus.                       - Gastritis. Biopsied.                       - 1 cm hiatal hernia.                       - Normal examined duodenum. Recommendation:       - Patient has a contact number available for                        emergencies. The signs and symptoms of potential                        delayed complications were discussed with the patient.                        Return to normal activities tomorrow. Written discharge                        instructions were provided to the patient.                       - Resume previous diet.                       - Continue present medications.                       - Await pathology results.                       - Return to physician assistant in 6 months.                       -  The findings and recommendations were discussed with                        the patient. Procedure Code(s):    --- Professional ---                       [phone], Esophagogastroduodenoscopy, flexible, transoral;                        with biopsy, single or multiple Diagnosis Code(s):    --- Professional ---                       R93.3, Abnormal findings on diagnostic imaging of other                        parts of digestive tract                       K44.9, Diaphragmatic hernia without obstruction or                        gangrene                       K29.70,  Gastritis, unspecified, without bleeding                       R13.10, Dysphagia, unspecified CPT copyright 2019 American Medical Association. All rights reserved. The codes documented in this report are preliminary and upon coder review may  be revised to meet current compliance requirements. Jennifer Kidneyeodoro K Kathyann Spaugh MD, MD 03/04/2019 8:21:09 AM This report has been signed electronically. Number of Addenda: 0 Note Initiated On: 03/04/2019 7:19 AM Estimated Blood Loss: Estimated blood loss: none.      [facility]

## 2019-03-04 NOTE — Progress Notes (Signed)
Difficult IV start. 2 attempts in right hand, unable to thread angiocath into vein

## 2019-03-04 NOTE — Anesthesia Preprocedure Evaluation (Addendum)
Anesthesia Evaluation  Patient identified by MRN, date of birth, ID band Patient awake    Reviewed: Allergy & Precautions, NPO status , Patient's Chart, lab work & pertinent test results  History of Anesthesia Complications Negative for: history of anesthetic complications  Airway Mallampati: III       Dental   Pulmonary asthma , neg COPD, neg recent URI, former smoker,           Cardiovascular (-) hypertension(-) Past MI and (-) CHF (-) dysrhythmias + pacemaker (-) Valvular Problems/Murmurs     Neuro/Psych neg Seizures    GI/Hepatic Neg liver ROS, GERD  Medicated and Poorly Controlled,  Endo/Other  neg diabetesHypothyroidism   Renal/GU negative Renal ROS     Musculoskeletal   Abdominal   Peds  Hematology   Anesthesia Other Findings   Reproductive/Obstetrics                            Anesthesia Physical Anesthesia Plan  ASA: II  Anesthesia Plan: General   Post-op Pain Management:    Induction: Intravenous  PONV Risk Score and Plan: Propofol infusion, TIVA and Treatment may vary due to age or medical condition  Airway Management Planned: Nasal Cannula  Additional Equipment:   Intra-op Plan:   Post-operative Plan:   Informed Consent: I have reviewed the patients History and Physical, chart, labs and discussed the procedure including the risks, benefits and alternatives for the proposed anesthesia with the patient or authorized representative who has indicated his/her understanding and acceptance.       Plan Discussed with:   Anesthesia Plan Comments:         Anesthesia Quick Evaluation

## 2019-03-05 ENCOUNTER — Encounter: Payer: Self-pay | Admitting: Internal Medicine

## 2019-03-05 LAB — SURGICAL PATHOLOGY

## 2019-06-15 ENCOUNTER — Other Ambulatory Visit: Payer: Self-pay | Admitting: Family Medicine

## 2019-06-15 DIAGNOSIS — Z1231 Encounter for screening mammogram for malignant neoplasm of breast: Secondary | ICD-10-CM

## 2019-07-22 ENCOUNTER — Ambulatory Visit
Admission: RE | Admit: 2019-07-22 | Discharge: 2019-07-22 | Disposition: A | Discharge status: Home / Self Care | Payer: Medicare HMO | Source: Ambulatory Visit | Attending: Family Medicine | Admitting: Family Medicine

## 2019-07-22 DIAGNOSIS — Z1231 Encounter for screening mammogram for malignant neoplasm of breast: Secondary | ICD-10-CM | POA: Diagnosis present

## 2019-08-10 ENCOUNTER — Other Ambulatory Visit: Payer: Self-pay

## 2019-08-10 DIAGNOSIS — Z20822 Contact with and (suspected) exposure to covid-19: Secondary | ICD-10-CM

## 2019-08-11 LAB — NOVEL CORONAVIRUS, NAA: SARS-CoV-2, NAA: NOT DETECTED

## 2019-12-11 ENCOUNTER — Ambulatory Visit: Payer: Medicare HMO | Admitting: Pulmonary Disease

## 2019-12-11 ENCOUNTER — Other Ambulatory Visit: Payer: Self-pay

## 2019-12-11 ENCOUNTER — Encounter: Payer: Self-pay | Admitting: Pulmonary Disease

## 2019-12-11 VITALS — BP 118/72 | HR 73 | Ht 60.0 in | Wt 150.8 lb

## 2019-12-11 DIAGNOSIS — R053 Chronic cough: Secondary | ICD-10-CM

## 2019-12-11 DIAGNOSIS — R05 Cough: Secondary | ICD-10-CM

## 2019-12-11 DIAGNOSIS — K219 Gastro-esophageal reflux disease without esophagitis: Secondary | ICD-10-CM

## 2019-12-11 NOTE — Patient Instructions (Signed)
Continue Flovent 2 puffs twice a day until we see you in follow-up.  We have ordered a spacer for your inhaler use.  We will see you in follow-up in 4 to 6 weeks time.  Continue taking Nexium.  Continue your gabapentin for now but hopefully we can decrease it and start "tapering off" when you come back.

## 2019-12-11 NOTE — Progress Notes (Signed)
    Assessment & Plan:  1. Chronic cough (Primary)  2. Gastroesophageal reflux disease without esophagitis  3. Laryngopharyngeal reflux (LPR)   Patient Instructions  Continue Flovent  2 puffs twice a day until we see you in follow-up.  We have ordered a spacer for your inhaler use.  We will see you in follow-up in 4 to 6 weeks time.  Continue taking Nexium.  Continue your gabapentin for now but hopefully we can decrease it and start tapering off when you come back.  Please note: late entry documentation due to logistical difficulties during COVID-19 pandemic. This note is filed for information purposes only, and is not intended to be used for billing, nor does it represent the full scope/nature of the visit in question. Please see any associated scanned media linked to date of encounter for additional pertinent information.  Subjective:    HPI: Jennifer Farmer is a 74 y.o. female presenting to the pulmonology clinic on 12/11/2019 with report of: Pulmonary Consult (Self Referral for cough. Patient reports that she has a productive cough with clear sputum. )     Outpatient Encounter Medications as of 12/11/2019  Medication Sig   [EXPIRED] benzonatate  (TESSALON ) 200 MG capsule Take 200 mg by mouth 3 (three) times daily as needed.    Melatonin 5 MG TABS Take 5 mg by mouth as needed.   Multiple Vitamins-Minerals (CENTRUM SILVER 50+WOMEN PO) Take 1 tablet by mouth daily.   Zinc Acetate 25 MG CAPS Take 25 mg by mouth daily.   [DISCONTINUED] albuterol  (VENTOLIN  HFA) 108 (90 Base) MCG/ACT inhaler Inhale 2 puffs into the lungs every 4 (four) hours as needed for wheezing or shortness of breath.   [DISCONTINUED] Calcium Carbonate-Vitamin D 600-400 MG-UNIT tablet Take 1 tablet by mouth 2 (two) times daily.  (Patient not taking: Reported on 10/24/2022)   [DISCONTINUED] esomeprazole (NEXIUM) 40 MG capsule Take 40 mg by mouth daily before supper.  (Patient not taking: Reported on 10/24/2022)    [DISCONTINUED] fluticasone  (FLOVENT  HFA) 110 MCG/ACT inhaler Inhale 1 puff into the lungs daily.    [DISCONTINUED] gabapentin (NEURONTIN) 300 MG capsule Take 600 mg by mouth 3 (three) times daily.    [DISCONTINUED] Probiotic Product (PROBIOTIC PO) Take 1 tablet by mouth daily. (Patient not taking: Reported on 05/16/2023)   [DISCONTINUED] simvastatin  (ZOCOR ) 20 MG tablet Take 20 mg by mouth daily at 6 PM.    [DISCONTINUED] HYDROcodone -acetaminophen  (NORCO) 5-325 MG tablet Take 1-2 tablets by mouth every 4 (four) hours as needed for moderate pain or severe pain. (Patient not taking: Reported on 03/04/2019)   [DISCONTINUED] HYDROCODONE -CHLORPHENIRAMINE PO Take by mouth as needed.   [DISCONTINUED] loratadine (CLARITIN) 10 MG tablet Take 10 mg by mouth daily.   [DISCONTINUED] ondansetron  (ZOFRAN  ODT) 4 MG disintegrating tablet Take 1 tablet (4 mg total) by mouth every 8 (eight) hours as needed for nausea or vomiting.   [DISCONTINUED] ranitidine (ZANTAC) 150 MG tablet Take 150 mg by mouth daily as needed for heartburn.    No facility-administered encounter medications on file as of 12/11/2019.      Objective:   Vitals:   12/11/19 1210  BP: 118/72  Pulse: 73  Height: 5' (1.524 m)  Weight: 150 lb 12.8 oz (68.4 kg)  SpO2: 98%  BMI (Calculated): 29.45     Physical exam documentation is limited by delayed entry of information.

## 2019-12-20 IMAGING — MR MR KNEE*L* W/O CM
7 series · 40 of 40 positions shown · non-contrast
Comparison: None.

CLINICAL DATA: Medial knee pain. Injured while exercising 1.5 years
ago.

EXAM:
MRI OF THE LEFT KNEE WITHOUT CONTRAST
TECHNIQUE: Multiplanar, multisequence MR imaging of the knee was performed. No
intravenous contrast was administered.

[Series 8: T2 fat-sat · axial · left · 4.0mm · 0.50mm/px · z∈[-89,+35]mm · 6 of 26 slices shown (1 of 3)]
[im 1/26]
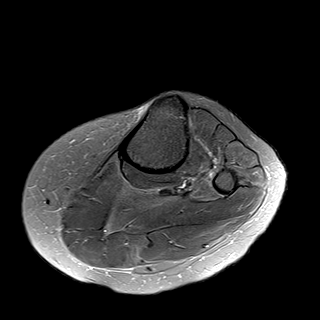
[im 6/26]
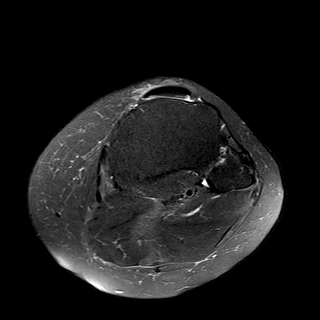
[im 11/26]
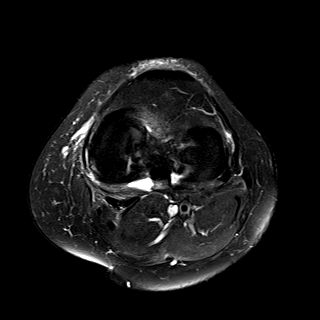
[im 16/26]
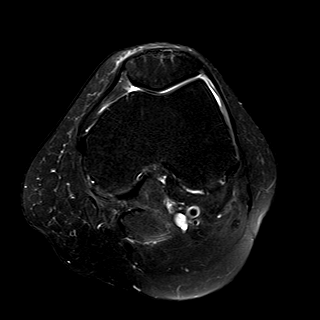
[im 21/26]
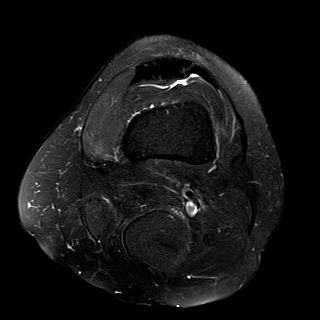
[im 26/26]
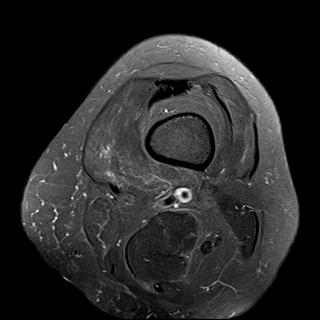

[Series 9: T1 · coronal · left · 4.0mm · 0.59mm/px · 6 of 27 slices shown]
[im 1/27]
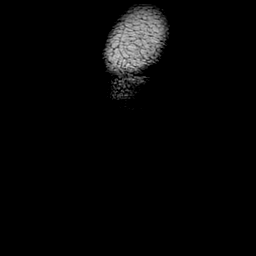
[im 6/27]
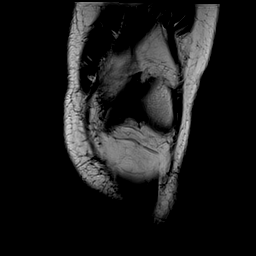
[im 11/27]
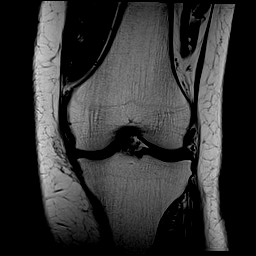
[im 16/27]
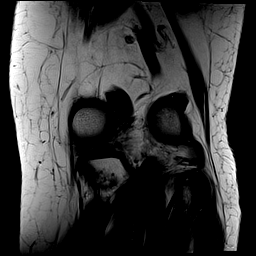
[im 21/27]
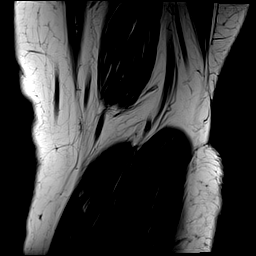
[im 27/27]
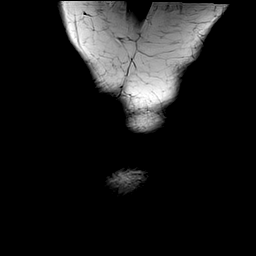

[Series 10: T2 fat-sat · coronal · left · 4.0mm · 0.59mm/px · 6 of 28 slices shown (2 of 3)]
[im 1/28]
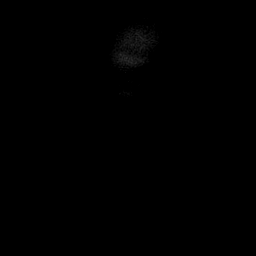
[im 6/28]
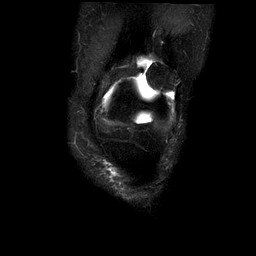
[im 11/28]
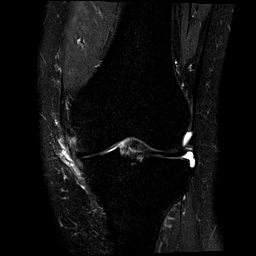
[im 17/28]
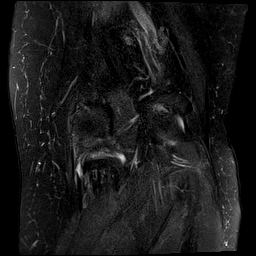
[im 22/28]
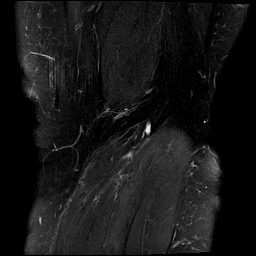
[im 28/28]
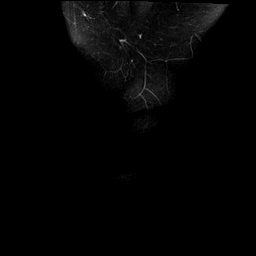

[Series 11: PD fat-sat · sagittal · left · 3.0mm · 0.59mm/px · 7 of 32 slices shown (1 of 2)]
[im 1/32]
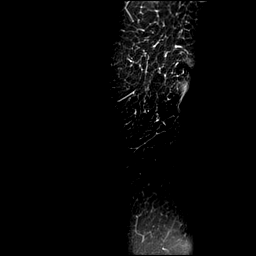
[im 6/32]
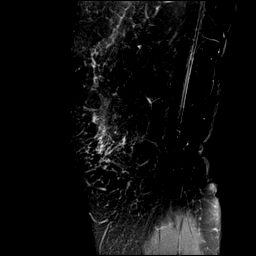
[im 11/32]
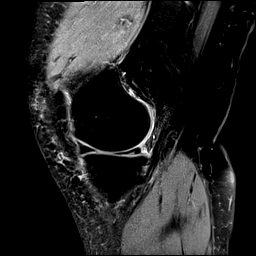
[im 16/32]
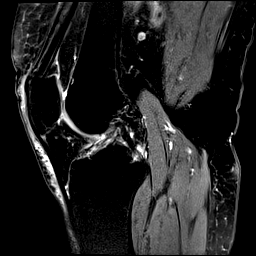
[im 21/32]
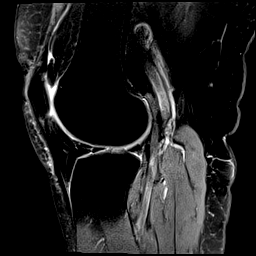
[im 26/32]
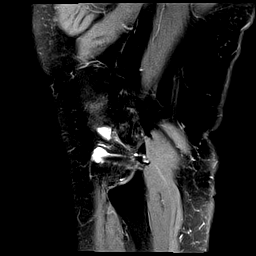
[im 32/32]
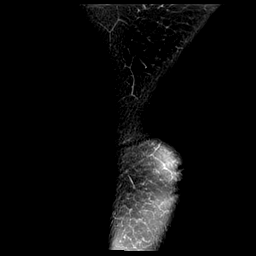

[Series 12: PD fat-sat · coronal · left · 4.0mm · 0.59mm/px · 6 of 28 slices shown (2 of 2)]
[im 1/28]
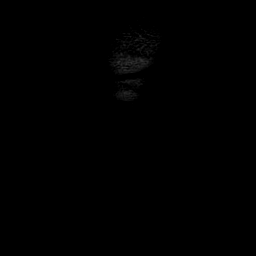
[im 6/28]
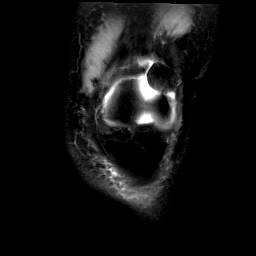
[im 11/28]
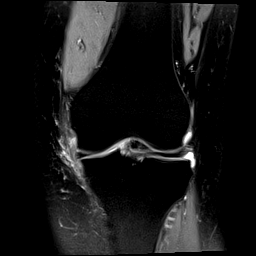
[im 17/28]
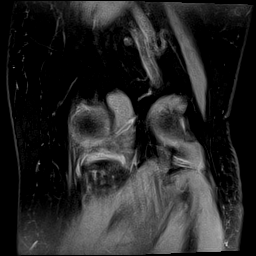
[im 22/28]
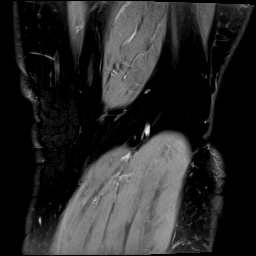
[im 28/28]
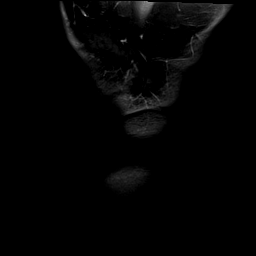

[Series 13: T2 fat-sat · sagittal · left · 3.0mm · 0.59mm/px · 7 of 32 slices shown (3 of 3)]
[im 1/32]
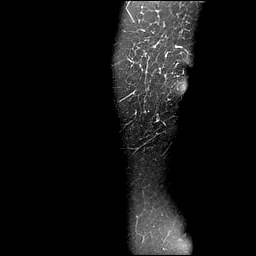
[im 6/32]
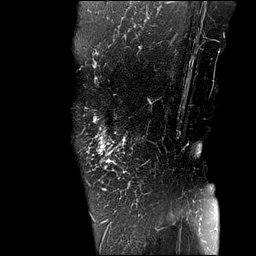
[im 11/32]
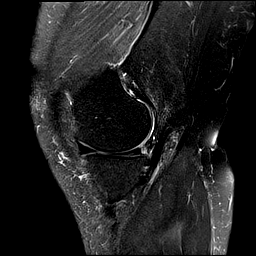
[im 16/32]
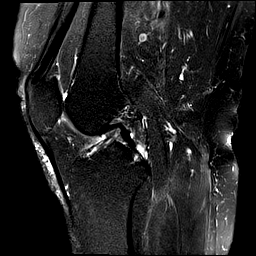
[im 21/32]
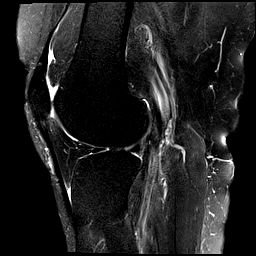
[im 26/32]
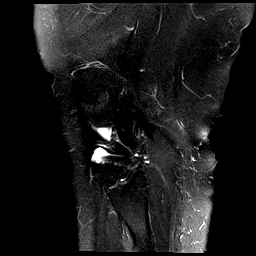
[im 32/32]
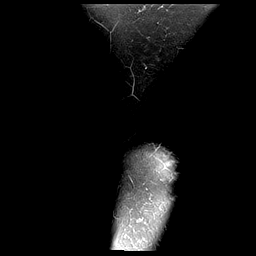

[Series 14: PD · coronal · left · 2.0mm · 0.47mm/px · 2 of 10 slices shown]
[im 1/10]
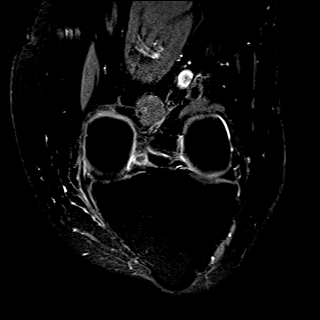
[im 10/10]
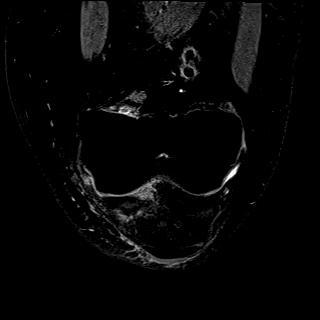

[40 of 40 positions shown; findings below may reference images not displayed]

FINDINGS: MENISCI

Medial meniscus: Oblique tear of the posterior horn-body junction of
the medial meniscus extending to the inferior articular surface and
extending into the remainder the posterior horn.

Lateral meniscus:  Intact.

LIGAMENTS

Cruciates:  Intact ACL and PCL.

Collaterals: Medial collateral ligament is intact. Lateral
collateral ligament complex is intact.

CARTILAGE

Patellofemoral:  No chondral defect.

Medial: Mild partial-thickness cartilage loss of the medial
femorotibial compartment.

Lateral:  No chondral defect.

Joint:  No joint effusion. Normal Hoffa's fat. No plical thickening.

Popliteal Fossa:  No Baker cyst. Intact popliteus tendon.

Extensor Mechanism: Intact quadriceps tendon. Intact patellar
tendon. Intact medial patellar retinaculum. Intact lateral patellar
retinaculum. Intact MPFL.

Bones:  No acute osseous abnormality.  No aggressive osseous lesion.

Other: No fluid collection or hematoma.
IMPRESSION: 1. Oblique tear of the posterior horn-body junction of the medial
meniscus extending to the inferior articular surface and extending
into the remainder the posterior horn.
2. Mild partial-thickness cartilage loss of the medial femorotibial
compartment.

## 2019-12-28 ENCOUNTER — Telehealth: Payer: Self-pay | Admitting: Pulmonary Disease

## 2019-12-28 NOTE — Telephone Encounter (Signed)
Per Dr. Jayme Cloud. I called and spoke with the patient and advised her that the referral would be placed after her follow up visit in May if her symptoms were not improving. She states that she's doing a little better and she will wait to discuss at her visit. Nothing further is needed.

## 2019-12-30 ENCOUNTER — Telehealth: Payer: Self-pay | Admitting: Pulmonary Disease

## 2019-12-30 MED ORDER — FLUTICASONE PROPIONATE HFA 110 MCG/ACT IN AERO: 1.0000 | INHALATION_SPRAY | Freq: Every day | RESPIRATORY_TRACT | 5 refills | Status: DC

## 2019-12-30 NOTE — Telephone Encounter (Signed)
Flovent refill sent to the pharmacy and patient made aware.

## 2020-01-14 ENCOUNTER — Encounter: Payer: Self-pay | Admitting: Pulmonary Disease

## 2020-01-14 ENCOUNTER — Ambulatory Visit: Payer: Medicare HMO | Admitting: Pulmonary Disease

## 2020-01-14 ENCOUNTER — Other Ambulatory Visit: Payer: Self-pay

## 2020-01-14 VITALS — BP 124/68 | HR 73 | Temp 97.7°F | Ht 61.0 in | Wt 154.0 lb

## 2020-01-14 DIAGNOSIS — R05 Cough: Secondary | ICD-10-CM

## 2020-01-14 DIAGNOSIS — R053 Chronic cough: Secondary | ICD-10-CM

## 2020-01-14 DIAGNOSIS — K219 Gastro-esophageal reflux disease without esophagitis: Secondary | ICD-10-CM

## 2020-01-14 MED ORDER — BENZONATATE 200 MG PO CAPS: 200.0000 mg | ORAL_CAPSULE | Freq: Two times a day (BID) | ORAL | 2 refills | Status: DC | PRN

## 2020-01-14 NOTE — Patient Instructions (Signed)
We refilled your benzonatate (Tessalon) capsules as needed for cough.   Give Zyrtec 10 mg at bedtime a try (this is an antihistamine)   We are providing you with a spacer for your inhaler.   We will see you in follow-up in 2 months time call sooner should any new difficulties arise or if symptoms persist.

## 2020-01-14 NOTE — Progress Notes (Signed)
    Assessment & Plan:  1. Chronic cough (Primary)  2. Gastroesophageal reflux disease without esophagitis  3. Laryngopharyngeal reflux (LPR)   Patient Instructions  We refilled your benzonatate  (Tessalon ) capsules as needed for cough.   Give Zyrtec 10 mg at bedtime a try (this is an antihistamine)   We are providing you with a spacer for your inhaler.   We will see you in follow-up in 2 months time call sooner should any new difficulties arise or if symptoms persist.  Please note: late entry documentation due to logistical difficulties during COVID-19 pandemic. This note is filed for information purposes only, and is not intended to be used for billing, nor does it represent the full scope/nature of the visit in question. Please see any associated scanned media linked to date of encounter for additional pertinent information.  Subjective:    HPI: Jennifer Farmer is a 74 y.o. female presenting to the pulmonology clinic on 01/14/2020 with report of: Follow-up (slightly productive cough with white sputum, SOB improved slightly )     Outpatient Encounter Medications as of 01/14/2020  Medication Sig   Melatonin 5 MG TABS Take 5 mg by mouth as needed.   Multiple Vitamins-Minerals (CENTRUM SILVER 50+WOMEN PO) Take 1 tablet by mouth daily.   Zinc Acetate 25 MG CAPS Take 25 mg by mouth daily.   [DISCONTINUED] albuterol  (VENTOLIN  HFA) 108 (90 Base) MCG/ACT inhaler Inhale 2 puffs into the lungs every 4 (four) hours as needed for wheezing or shortness of breath.   [DISCONTINUED] benzonatate  (TESSALON ) 200 MG capsule Take 200 mg by mouth 2 (two) times daily as needed for cough.   [DISCONTINUED] benzonatate  (TESSALON ) 200 MG capsule Take 1 capsule (200 mg total) by mouth 2 (two) times daily as needed for cough.   [DISCONTINUED] Calcium Carbonate-Vitamin D 600-400 MG-UNIT tablet Take 1 tablet by mouth 2 (two) times daily.  (Patient not taking: Reported on 10/24/2022)   [DISCONTINUED]  esomeprazole (NEXIUM) 40 MG capsule Take 40 mg by mouth daily before supper.  (Patient not taking: Reported on 10/24/2022)   [DISCONTINUED] gabapentin (NEURONTIN) 300 MG capsule Take 600 mg by mouth 3 (three) times daily.    [DISCONTINUED] montelukast  (SINGULAIR ) 10 MG tablet Take 10 mg by mouth at bedtime.   [DISCONTINUED] Probiotic Product (PROBIOTIC PO) Take 1 tablet by mouth daily. (Patient not taking: Reported on 05/16/2023)   [DISCONTINUED] simvastatin  (ZOCOR ) 20 MG tablet Take 20 mg by mouth daily at 6 PM.    [DISCONTINUED] fluticasone  (FLOVENT  HFA) 110 MCG/ACT inhaler Inhale 1 puff into the lungs daily for 14 days.   No facility-administered encounter medications on file as of 01/14/2020.      Objective:   Vitals:   01/14/20 1031  BP: 124/68  Pulse: 73  Temp: 97.7 F (36.5 C)  Height: 5' 1 (1.549 m)  Weight: 154 lb (69.9 kg)  SpO2: 98%  TempSrc: Temporal  BMI (Calculated): 29.11     Physical exam documentation is limited by delayed entry of information.

## 2020-02-10 DIAGNOSIS — E6609 Other obesity due to excess calories: Secondary | ICD-10-CM | POA: Insufficient documentation

## 2020-03-09 ENCOUNTER — Telehealth: Payer: Self-pay | Admitting: Pulmonary Disease

## 2020-03-09 DIAGNOSIS — R053 Chronic cough: Secondary | ICD-10-CM

## 2020-03-09 MED ORDER — PREDNISONE 10 MG (21) PO TBPK: ORAL_TABLET | ORAL | 0 refills | Status: DC

## 2020-03-09 NOTE — Telephone Encounter (Signed)
Lm for pt

## 2020-03-09 NOTE — Telephone Encounter (Signed)
Pt was returning phone call to [facility]

## 2020-03-09 NOTE — Telephone Encounter (Signed)
Rx for prednisone has been sent to preferred pharmacy. PFT has been ordered.  Pt is aware and voiced her understanding.  Nothing further is needed.

## 2020-03-09 NOTE — Telephone Encounter (Signed)
Called and spoke to pt, who reports of increased sob, chest heaviness and non prod cough x5d. Cough worsens at night.  Pt using albuterol HFA once nightly and flovent BID with no improvement in sx.  Denied fever, chills or sweats.   Dr. Jayme Cloud, please advise. Thanks

## 2020-03-09 NOTE — Telephone Encounter (Signed)
She has appointment with me on the 12th which is Monday.  She will need PFTs ordered.  We can call in a prednisone taper 10 mg prednisone No. 21 tablets as directed in the package.

## 2020-03-11 ENCOUNTER — Telehealth: Payer: Self-pay

## 2020-03-11 NOTE — Telephone Encounter (Signed)
Pt is aware of date/time of coivd test prior to PFT.  Pt voiced her understanding and had no further questions.  Nothing further is needed.

## 2020-03-14 ENCOUNTER — Ambulatory Visit
Admission: RE | Admit: 2020-03-14 | Discharge: 2020-03-14 | Disposition: A | Discharge status: Home / Self Care | Payer: Medicare HMO | Source: Ambulatory Visit | Attending: Pulmonary Disease | Admitting: Pulmonary Disease

## 2020-03-14 ENCOUNTER — Other Ambulatory Visit: Payer: Self-pay

## 2020-03-14 ENCOUNTER — Encounter: Payer: Self-pay | Admitting: Pulmonary Disease

## 2020-03-14 ENCOUNTER — Ambulatory Visit: Payer: Medicare HMO | Admitting: Pulmonary Disease

## 2020-03-14 VITALS — BP 124/72 | HR 69 | Temp 98.1°F | Ht 61.0 in | Wt 152.0 lb

## 2020-03-14 DIAGNOSIS — K219 Gastro-esophageal reflux disease without esophagitis: Secondary | ICD-10-CM

## 2020-03-14 DIAGNOSIS — R05 Cough: Secondary | ICD-10-CM | POA: Diagnosis not present

## 2020-03-14 DIAGNOSIS — R053 Chronic cough: Secondary | ICD-10-CM

## 2020-03-14 MED ORDER — BREO ELLIPTA 100-25 MCG/INH IN AEPB: 1.0000 | INHALATION_SPRAY | Freq: Every day | RESPIRATORY_TRACT | 0 refills | Status: AC

## 2020-03-14 MED ORDER — BREO ELLIPTA 100-25 MCG/INH IN AEPB: 1.0000 | INHALATION_SPRAY | Freq: Every day | RESPIRATORY_TRACT | 11 refills | Status: DC

## 2020-03-14 NOTE — Progress Notes (Signed)
 Assessment & Plan:  1. Chronic cough (Primary) - Allergen Panel (27) + IGE; Future - CBC w/Diff; Future - Hypersensitivity Pneumonitis; Future - DG Chest 2 View; Future  2. Gastroesophageal reflux disease without esophagitis  3. Laryngopharyngeal reflux (LPR)   Patient Instructions  What we discussed today: We are changing your inhaler to Breo Ellipta  100/25, 1 inhalation daily Do not use Flovent  anymore You may use your as needed inhaler up to 4 times a day We are getting some blood work which you can have done on the day of your breathing test Do not miss your appointment to your breathing tests Make sure you get your Covid test prior to your breathing test We will see you in follow-up in 3 to 4 weeks time If you qualify for the asthma controller shot we will initiate paperwork then  Please note: late entry documentation due to logistical difficulties during COVID-19 pandemic. This note is filed for information purposes only, and is not intended to be used for billing, nor does it represent the full scope/nature of the visit in question. Please see any associated scanned media linked to date of encounter for additional pertinent information.  Subjective:    HPI: Jennifer Farmer is a 74 y.o. female presenting to the pulmonology clinic on 03/14/2020 with report of: Follow-up (currently taking prednisone  taper. c/o non prod cough. )     Outpatient Encounter Medications as of 03/14/2020  Medication Sig   Melatonin 5 MG TABS Take 5 mg by mouth as needed.   Multiple Vitamins-Minerals (CENTRUM SILVER 50+WOMEN PO) Take 1 tablet by mouth daily.   Zinc Acetate 25 MG CAPS Take 25 mg by mouth daily.   [DISCONTINUED] albuterol  (VENTOLIN  HFA) 108 (90 Base) MCG/ACT inhaler Inhale 2 puffs into the lungs every 4 (four) hours as needed for wheezing or shortness of breath.   [DISCONTINUED] benzonatate  (TESSALON ) 200 MG capsule Take 1 capsule (200 mg total) by mouth 2 (two) times daily as  needed for cough.   [DISCONTINUED] Calcium Carbonate-Vitamin D 600-400 MG-UNIT tablet Take 1 tablet by mouth 2 (two) times daily.  (Patient not taking: Reported on 10/24/2022)   [DISCONTINUED] esomeprazole (NEXIUM) 40 MG capsule Take 40 mg by mouth daily before supper.  (Patient not taking: Reported on 10/24/2022)   [DISCONTINUED] montelukast  (SINGULAIR ) 10 MG tablet Take 10 mg by mouth at bedtime.   [DISCONTINUED] predniSONE  (STERAPRED UNI-PAK 21 TAB) 10 MG (21) TBPK tablet Use as directed   [DISCONTINUED] Probiotic Product (PROBIOTIC PO) Take 1 tablet by mouth daily. (Patient not taking: Reported on 05/16/2023)   [DISCONTINUED] simvastatin  (ZOCOR ) 20 MG tablet Take 20 mg by mouth daily at 6 PM.    [EXPIRED] fluticasone  furoate-vilanterol (BREO ELLIPTA ) 100-25 MCG/INH AEPB Inhale 1 puff into the lungs daily for 1 day.   [DISCONTINUED] fluticasone  (FLOVENT  HFA) 110 MCG/ACT inhaler Inhale 1 puff into the lungs daily for 14 days.   [DISCONTINUED] fluticasone  furoate-vilanterol (BREO ELLIPTA ) 100-25 MCG/INH AEPB Inhale 1 puff into the lungs daily.   [DISCONTINUED] gabapentin (NEURONTIN) 300 MG capsule Take 600 mg by mouth 3 (three) times daily.    No facility-administered encounter medications on file as of 03/14/2020.      Objective:   Vitals:   03/14/20 1018  BP: 124/72  Pulse: 69  Temp: 98.1 F (36.7 C)  Height: 5' 1 (1.549 m)  Weight: 152 lb (68.9 kg)  SpO2: 99%  TempSrc: Temporal  BMI (Calculated): 28.74     Physical exam documentation is limited  by delayed entry of information.

## 2020-03-14 NOTE — Patient Instructions (Signed)
What we discussed today:  We are changing your inhaler to Breo Ellipta 100/25, 1 inhalation daily  Do not use Flovent anymore  You may use your as needed inhaler up to 4 times a day  We are getting some blood work which you can have done on the day of your breathing test  Do not miss your appointment to your breathing tests  Make sure you get your Covid test prior to your breathing test  We will see you in follow-up in 3 to 4 weeks time  If you qualify for the asthma controller shot we will initiate paperwork then

## 2020-03-15 ENCOUNTER — Other Ambulatory Visit
Admission: RE | Admit: 2020-03-15 | Discharge: 2020-03-15 | Disposition: A | Discharge status: Home / Self Care | Payer: Medicare HMO | Source: Ambulatory Visit | Attending: Pulmonary Disease | Admitting: Pulmonary Disease

## 2020-03-15 ENCOUNTER — Other Ambulatory Visit: Payer: Self-pay

## 2020-03-15 DIAGNOSIS — R053 Chronic cough: Secondary | ICD-10-CM

## 2020-03-15 DIAGNOSIS — Z20822 Contact with and (suspected) exposure to covid-19: Secondary | ICD-10-CM | POA: Insufficient documentation

## 2020-03-15 MED ORDER — AZITHROMYCIN 250 MG PO TABS
ORAL_TABLET | ORAL | 0 refills | Status: AC
Start: 2020-03-15 — End: 2020-03-20

## 2020-03-16 ENCOUNTER — Other Ambulatory Visit
Admission: RE | Admit: 2020-03-16 | Discharge: 2020-03-16 | Disposition: A | Discharge status: Home / Self Care | Payer: Medicare HMO | Attending: Pulmonary Disease | Admitting: Pulmonary Disease

## 2020-03-16 ENCOUNTER — Other Ambulatory Visit: Payer: Self-pay

## 2020-03-16 ENCOUNTER — Ambulatory Visit (HOSPITAL_COMMUNITY): Payer: Medicare HMO

## 2020-03-16 DIAGNOSIS — R05 Cough: Secondary | ICD-10-CM

## 2020-03-16 DIAGNOSIS — R053 Chronic cough: Secondary | ICD-10-CM

## 2020-03-16 LAB — CBC WITH DIFFERENTIAL/PLATELET
Abs Immature Granulocytes: 0.07 10*3/uL (ref 0.00–0.07)
Basophils Absolute: 0.1 10*3/uL (ref 0.0–0.1)
Basophils Relative: 1 %
Eosinophils Absolute: 0.2 10*3/uL (ref 0.0–0.5)
Eosinophils Relative: 2 %
HCT: 44.1 % (ref 36.0–46.0)
Hemoglobin: 15.4 g/dL — ABNORMAL HIGH (ref 12.0–15.0)
Immature Granulocytes: 1 %
Lymphocytes Relative: 53 %
Lymphs Abs: 6 10*3/uL — ABNORMAL HIGH (ref 0.7–4.0)
MCH: 32.1 pg (ref 26.0–34.0)
MCHC: 34.9 g/dL (ref 30.0–36.0)
MCV: 91.9 fL (ref 80.0–100.0)
Monocytes Absolute: 0.6 10*3/uL (ref 0.1–1.0)
Monocytes Relative: 6 %
Neutro Abs: 4 10*3/uL (ref 1.7–7.7)
Neutrophils Relative %: 37 %
Platelets: 329 10*3/uL (ref 150–400)
RBC: 4.8 MIL/uL (ref 3.87–5.11)
RDW: 13.2 % (ref 11.5–15.5)
WBC: 10.9 10*3/uL — ABNORMAL HIGH (ref 4.0–10.5)
nRBC: 0 % (ref 0.0–0.2)

## 2020-03-16 LAB — SARS CORONAVIRUS 2 (TAT 6-24 HRS): SARS Coronavirus 2: NEGATIVE

## 2020-03-16 MED ORDER — ALBUTEROL SULFATE (2.5 MG/3ML) 0.083% IN NEBU
2.5000 mg | INHALATION_SOLUTION | Freq: Once | RESPIRATORY_TRACT | Status: AC
  Administered 2020-03-16: 2.5 mg via RESPIRATORY_TRACT

## 2020-03-21 LAB — HYPERSENSITIVITY PNEUMONITIS
A. Pullulans Abs: NEGATIVE
A.Fumigatus #1 Abs: NEGATIVE
Micropolyspora faeni, IgG: NEGATIVE
Pigeon Serum Abs: NEGATIVE
Thermoact. Saccharii: NEGATIVE
Thermoactinomyces vulgaris, IgG: NEGATIVE

## 2020-03-22 LAB — ALLERGEN PANEL (27) + IGE
Alternaria Alternata IgE: <0.1 kU/L
Bermuda Grass IgE: <0.1 kU/L
Cat Dander IgE: <0.1 kU/L
Cladosporium Herbarum IgE: <0.1 kU/L
D Farinae IgE: <0.1 kU/L
D Pteronyssinus IgE: <0.1 kU/L
Dog Dander IgE: <0.1 kU/L
Johnson Grass IgE: <0.1 kU/L
Kentucky Bluegrass IgE: <0.1 kU/L
Ragweed, Short IgE: 0.88 kU/L — AB
Timothy Grass IgE: <0.1 kU/L

## 2020-03-30 ENCOUNTER — Other Ambulatory Visit: Payer: Self-pay | Admitting: Pulmonary Disease

## 2020-04-04 ENCOUNTER — Other Ambulatory Visit
Admission: RE | Admit: 2020-04-04 | Discharge: 2020-04-04 | Disposition: A | Discharge status: Home / Self Care | Payer: Medicare HMO | Source: Home / Self Care | Attending: Primary Care | Admitting: Primary Care

## 2020-04-04 ENCOUNTER — Ambulatory Visit (INDEPENDENT_AMBULATORY_CARE_PROVIDER_SITE_OTHER): Payer: Medicare HMO | Admitting: Primary Care

## 2020-04-04 ENCOUNTER — Ambulatory Visit
Admission: RE | Admit: 2020-04-04 | Discharge: 2020-04-04 | Disposition: A | Discharge status: Home / Self Care | Payer: Medicare HMO | Source: Ambulatory Visit | Attending: Primary Care | Admitting: Primary Care

## 2020-04-04 ENCOUNTER — Encounter: Payer: Self-pay | Admitting: Primary Care

## 2020-04-04 ENCOUNTER — Ambulatory Visit
Admission: RE | Admit: 2020-04-04 | Discharge: 2020-04-04 | Disposition: A | Discharge status: Home / Self Care | Payer: Medicare HMO | Attending: Primary Care | Admitting: Primary Care

## 2020-04-04 ENCOUNTER — Other Ambulatory Visit: Payer: Self-pay

## 2020-04-04 VITALS — BP 110/76 | HR 75 | Temp 97.1°F | Ht 61.0 in | Wt 150.0 lb

## 2020-04-04 DIAGNOSIS — J45909 Unspecified asthma, uncomplicated: Secondary | ICD-10-CM | POA: Insufficient documentation

## 2020-04-04 DIAGNOSIS — J40 Bronchitis, not specified as acute or chronic: Secondary | ICD-10-CM

## 2020-04-04 LAB — CBC
HCT: 41.7 % (ref 36.0–46.0)
Hemoglobin: 14.6 g/dL (ref 12.0–15.0)
MCH: 31.9 pg (ref 26.0–34.0)
MCHC: 35 g/dL (ref 30.0–36.0)
MCV: 91 fL (ref 80.0–100.0)
Platelets: 266 10*3/uL (ref 150–400)
RBC: 4.58 MIL/uL (ref 3.87–5.11)
RDW: 13.1 % (ref 11.5–15.5)
WBC: 5.4 10*3/uL (ref 4.0–10.5)
nRBC: 0 % (ref 0.0–0.2)

## 2020-04-04 MED ORDER — MONTELUKAST SODIUM 10 MG PO TABS: 10.0000 mg | ORAL_TABLET | Freq: Every day | ORAL | 5 refills | Status: DC

## 2020-04-04 NOTE — Assessment & Plan Note (Signed)
-   FEV1 1.77 (92%), ratio 84, DLCO unc 8.81 (50%) - Contineu Breo 100 one puff daily - Adding Singulair 10mg  at bedtime - Eosinophils 200, Resp allegy panel + ragweed  - Checking aspergillus panel and repeat CBC

## 2020-04-04 NOTE — Assessment & Plan Note (Addendum)
-   CXR 03/14/20 showed subtle RUL density. WBC slightly elevated at 10.9. She was treated for suspected bronchitis with course of Azithromycin with some improvement  - Repeat CXR and CBC today. If area has not resolved recommend getting CT chest to better evaluate

## 2020-04-04 NOTE — Progress Notes (Signed)
Please let patient know CXR was normal. No active cardiopulmonary disease, lungs clear.

## 2020-04-04 NOTE — Progress Notes (Signed)
@Patient  ID: , female    DOB: [birth year], 74 y.o.   MRN: [id number]  Chief Complaint  Patient presents with  . Follow-up    c/o dry cough.     Referring provider: [id number], MD  HPI: 74 year old female, former smoker. PMH significant for chronic cough. Patient of Dr. 66, last seen on 03/14/20. Changed to Breo Ellipta 100. Follow-up in 3-4 weeks.  04/04/2020 Patient presents today for 3-4 week follow-up for cough/asthma. She is feeling some better. States that she was having frequent asthma attacks. States that medication will help for a little while and then her symptoms return. She had a CXR at her last visit that showed subtle density RUL. She was treated for bronchitis symptoms with azithromycin. CBC showed elevated 10.9, eosinophils were 200. Respiratory allergy panel was positive to ragweed (a portion was unable to be done d/t insufficient specimen) . PFTs showed no evidence of restriction or obstruction, moderate-severe diffusion capacity. Denies shortness of breath or wheezing lately.   Imaging: 03/14/20 CXR - Subtle density in the right upper lobe, early infection is not excluded  Pulmonary testing: 03/16/20 PFTs: FVC 2.11 (84%), FEV1 1.77 (92%), ratio 84, DLCO unc 8.81 (50%) Interpretation: No Restriction or obstruction. No BD response. Moderate-severe diffusion defect  Allergies  Allergen Reactions  . Adhesive [Tape] Itching    Redness     Immunization History  Administered Date(s) Administered  . Fluad Quad(high Dose 65+) 06/18/2019, 07/09/2019  . Influenza, High Dose Seasonal PF 06/21/2018  . Influenza-Unspecified 07/05/2015, 06/03/2016, 06/25/2017  . PFIZER SARS-COV-2 Vaccination 12/22/2019, 01/12/2020  . Pneumococcal Conjugate-13 01/11/2016  . Pneumococcal Polysaccharide-23 06/25/2017  . Tdap 03/23/2016    Past Medical History:  Diagnosis Date  . Edema    FEET/LEGS  . Enlarged thyroid   . GERD (gastroesophageal reflux disease)     . Hyperlipidemia   . Hypothyroidism   . Motion sickness    boats  . Obesity   . Obesity   . Osteopenia   . Palpitations   . Pure hypercholesterolemia with target low density lipoprotein (LDL) cholesterol less than 130 mg/dL     Tobacco History: Social History   Tobacco Use  Smoking Status Former Smoker  . Packs/day: 1.00  . Years: 10.00  . Pack years: 10.00  . Types: Cigarettes  . Quit date: 09/03/1998  . Years since quitting: 21.6  Smokeless Tobacco Never Used   Counseling given: Not Answered   Outpatient Medications Prior to Visit  Medication Sig Dispense Refill  . albuterol (VENTOLIN HFA) 108 (90 Base) MCG/ACT inhaler Inhale 2 puffs into the lungs every 4 (four) hours as needed for wheezing or shortness of breath.    . benzonatate (TESSALON) 200 MG capsule TAKE 1 CAPSULE(200 MG) BY MOUTH TWICE DAILY AS NEEDED FOR COUGH 30 capsule 0  . Calcium Carbonate-Vitamin D (CALTRATE 600+D) 600-400 MG-UNIT tablet Take 1 tablet by mouth 2 (two) times daily.     11/02/1998 esomeprazole (NEXIUM) 40 MG capsule Take 40 mg by mouth daily before supper.     . fluticasone furoate-vilanterol (BREO ELLIPTA) 100-25 MCG/INH AEPB Inhale 1 puff into the lungs daily. 28 each 11  . Melatonin 5 MG TABS Take 5 mg by mouth at bedtime.    . Multiple Vitamins-Minerals (CENTRUM SILVER 50+WOMEN PO) Take 1 tablet by mouth daily.     . Probiotic Product (PROBIOTIC PO) Take 1 tablet by mouth daily.    . simvastatin (ZOCOR) 20 MG tablet Take 20 mg by  mouth daily at 6 PM.     . Zinc Acetate 25 MG CAPS Take 25 mg by mouth daily.    . predniSONE (STERAPRED UNI-PAK 21 TAB) 10 MG (21) TBPK tablet Use as directed 21 each 0  . fluticasone (FLOVENT HFA) 110 MCG/ACT inhaler Inhale 1 puff into the lungs daily for 14 days. 1 Inhaler 5   No facility-administered medications prior to visit.    Review of Systems  Review of Systems  Constitutional: Negative.   Respiratory: Positive for cough. Negative for shortness of breath  and wheezing.      Physical Exam  BP 110/76 (BP Location: Left Arm, Cuff Size: Normal)   Pulse 75   Temp (!) 97.1 F (36.2 C) (Temporal)   Ht 5\' 1"  (1.549 m)   Wt 150 lb (68 kg)   SpO2 100%   BMI 28.34 kg/m  Physical Exam Constitutional:      Appearance: Normal appearance. She is not ill-appearing.  HENT:     Head: Normocephalic and atraumatic.     Mouth/Throat:     Mouth: Mucous membranes are moist.     Pharynx: Oropharynx is clear.  Cardiovascular:     Rate and Rhythm: Normal rate and regular rhythm.  Pulmonary:     Effort: Pulmonary effort is normal.     Breath sounds: Normal breath sounds.     Comments: Congested cough Skin:    General: Skin is warm and dry.  Neurological:     General: No focal deficit present.     Mental Status: She is alert and oriented to person, place, and time. Mental status is at baseline.  Psychiatric:        Mood and Affect: Mood normal.        Behavior: Behavior normal.        Thought Content: Thought content normal.        Judgment: Judgment normal.      Lab Results:  CBC    Component Value Date/Time   WBC 10.9 (H) 03/16/2020 1414   RBC 4.80 03/16/2020 1414   HGB 15.4 (H) 03/16/2020 1414   HCT 44.1 03/16/2020 1414   PLT 329 03/16/2020 1414   MCV 91.9 03/16/2020 1414   MCH 32.1 03/16/2020 1414   MCHC 34.9 03/16/2020 1414   RDW 13.2 03/16/2020 1414   LYMPHSABS 6.0 (H) 03/16/2020 1414   MONOABS 0.6 03/16/2020 1414   EOSABS 0.2 03/16/2020 1414   BASOSABS 0.1 03/16/2020 1414    BMET No results found for: NA, K, CL, CO2, GLUCOSE, BUN, CREATININE, CALCIUM, GFRNONAA, GFRAA  BNP No results found for: BNP  ProBNP No results found for: PROBNP  Imaging: DG Chest 2 View  Result Date: 03/14/2020 CLINICAL DATA:  Cough. Personal history of smoking. Nonsmoker for over 20 years. EXAM: CHEST - 2 VIEW COMPARISON:  None. FINDINGS: Heart size is normal. Subtle density is present in the right upper lobe. No significant adenopathy is  present. Atherosclerotic changes are noted at the aortic arch. Other significant airspace disease is present. No effusions are present. Mild degenerative changes are present in the midthoracic spine. IMPRESSION: 1. Subtle density in the right upper lobe. Early infection is not excluded. 2. No other significant airspace disease. 3. Atherosclerosis. Electronically Signed   By: 05/15/2020 M.D.   On: 03/14/2020 16:12   Pulmonary Function Test [facility] Only  Result Date: 03/17/2020 Spirometry Data Is Acceptable and Reproducible No obvious evidence of Obstructive Airways disease or Restrictive Lung disease Consider outpatient follow  up with Pulmonary if needed. Clinical Correlation Advised     Assessment & Plan:   Bronchitis - CXR 03/14/20 showed subtle RUL density. WBC slightly elevated at 10.9. She was treated for suspected bronchitis with course of Azithromycin with some improvement  - Repeat CXR and CBC today. If area has not resolved recommend getting CT chest to better evaluate   Asthma due to seasonal allergies - FEV1 1.77 (92%), ratio 84, DLCO unc 8.81 (50%) - Contineu Breo 100 one puff daily - Adding Singulair 10mg  at bedtime - Eosinophils 200, Resp allegy panel + ragweed  - Checking aspergillus panel and repeat CBC    , NP 04/04/2020

## 2020-04-04 NOTE — Patient Instructions (Signed)
Pleasure seeing you today Jennifer Farmer  Orders: Labs today (aspergillus panel and repeat CBC) Repeat CXR today- if area has not resolved will check CT chest when you return from vacation  Recommendations: Continue Breo 100 one puff daily (rinse mouth after use) Continue Benzonatate 1 capsule by mouth twice daily  Adding Singulair 10mg  at bedtime Continue over the counter antihistamine   Follow-up: 3 months with Dr. 

## 2020-04-12 NOTE — Progress Notes (Signed)
Agree with the details of the procedure as noted below by Ames Dura, NP.  Gailen Shelter, MD [facility]

## 2020-04-14 ENCOUNTER — Other Ambulatory Visit: Payer: Self-pay | Admitting: Pulmonary Disease

## 2020-05-01 ENCOUNTER — Other Ambulatory Visit: Payer: Self-pay | Admitting: Pulmonary Disease

## 2020-05-05 NOTE — Telephone Encounter (Signed)
Calling to get a refill on benzonatate called in to [facility] at [address] [place] [address]. Pt can be reached at [phone]

## 2020-06-06 ENCOUNTER — Other Ambulatory Visit: Payer: Self-pay | Admitting: Pulmonary Disease

## 2020-06-20 ENCOUNTER — Other Ambulatory Visit: Payer: Self-pay | Admitting: Pulmonary Disease

## 2020-06-23 ENCOUNTER — Other Ambulatory Visit: Payer: Self-pay | Admitting: Family Medicine

## 2020-06-23 DIAGNOSIS — Z1231 Encounter for screening mammogram for malignant neoplasm of breast: Secondary | ICD-10-CM

## 2020-07-05 ENCOUNTER — Ambulatory Visit (INDEPENDENT_AMBULATORY_CARE_PROVIDER_SITE_OTHER): Payer: Medicare HMO | Admitting: Pulmonary Disease

## 2020-07-05 ENCOUNTER — Other Ambulatory Visit: Payer: Self-pay

## 2020-07-05 ENCOUNTER — Encounter: Payer: Self-pay | Admitting: Pulmonary Disease

## 2020-07-05 VITALS — BP 122/82 | HR 78 | Temp 97.0°F | Ht <= 58 in | Wt 154.0 lb

## 2020-07-05 DIAGNOSIS — J849 Interstitial pulmonary disease, unspecified: Secondary | ICD-10-CM

## 2020-07-05 DIAGNOSIS — J45909 Unspecified asthma, uncomplicated: Secondary | ICD-10-CM | POA: Diagnosis not present

## 2020-07-05 DIAGNOSIS — R053 Chronic cough: Secondary | ICD-10-CM

## 2020-07-05 DIAGNOSIS — K219 Gastro-esophageal reflux disease without esophagitis: Secondary | ICD-10-CM

## 2020-07-05 MED ORDER — BENZONATATE 200 MG PO CAPS
200.0000 mg | ORAL_CAPSULE | Freq: Every day | ORAL | 6 refills | Status: DC
Start: 2020-07-05 — End: 2022-10-24

## 2020-07-05 MED ORDER — ARNUITY ELLIPTA 200 MCG/ACT IN AEPB: 1.0000 | INHALATION_SPRAY | Freq: Every day | RESPIRATORY_TRACT | 11 refills | Status: DC

## 2020-07-05 NOTE — Progress Notes (Signed)
Subjective:    Patient ID: Jennifer Farmer, female    DOB: 06-08-1946, 74 y.o.   MRN: [id number]  HPI Jennifer Farmer 74 year old very remote former smoker who follows here for the issue of chronic cough.  He appears to have seasonal worsening of her symptoms.  She has tried [employer] but does not feel that is any different from inhaled corticosteroid alone.  She was treated for bronchitis before her last visit in August.  Chest x-ray performed 2 August not show any acute pulmonary disease.  Does not have any fevers, chills or sweats.  She does have persistent issues with gastroesophageal reflux.  She follows with GI at [facility].  [place]ors any other symptomatology today.  Her cough remains nonproductive.  No hemoptysis.  DATA: 03/16/20 PFTs: FVC 2.11 (84%), FEV1 1.77 (92%), ratio 84, DLCO unc 8.81 (50%) Interpretation: No Restriction or obstruction. No BD response. Moderate-severe diffusion defect 03/14/20 CXR: Subtle density in the right upper lobe, early infection is not excluded  Review of Systems A 10 point review of systems was performed and it is as noted above otherwise negative.  Patient Active Problem List   Diagnosis Date Noted  . Bronchitis 04/04/2020  . Asthma due to seasonal allergies 04/04/2020  . Gastroesophageal reflux disease without esophagitis 07/04/2017  . Obesity (BMI 30.0-34.9) 07/04/2017  . Pure hypercholesterolemia 07/04/2017  . Osteopenia 07/04/2017   Social History   Tobacco Use  . Smoking status: Former Smoker    Packs/day: 1.00    Years: 10.00    Pack years: 10.00    Types: Cigarettes    Quit date: 09/03/1998    Years since quitting: 22.3  . Smokeless tobacco: Never Used  Substance Use Topics  . Alcohol use: No    Comment: rarely    Allergies  Allergen Reactions  . Adhesive [Tape] Itching    Redness    Current Meds  Medication Sig  . albuterol (VENTOLIN HFA) 108 (90 Base) MCG/ACT inhaler Inhale 2 puffs into the lungs every 4 (four) hours as  needed for wheezing or shortness of breath.  . benzonatate (TESSALON) 200 MG capsule TAKE 1 CAPSULE(200 MG) BY MOUTH TWICE DAILY AS NEEDED FOR COUGH  . Calcium Carbonate-Vitamin D (CALTRATE 600+D) 600-400 MG-UNIT tablet Take 1 tablet by mouth 2 (two) times daily.   Marland Kitchen esomeprazole (NEXIUM) 40 MG capsule Take 40 mg by mouth daily before supper.   . fluticasone furoate-vilanterol (BREO ELLIPTA) 100-25 MCG/INH AEPB Inhale 1 puff into the lungs daily.  . Melatonin 5 MG TABS Take 5 mg by mouth at bedtime.  . montelukast (SINGULAIR) 10 MG tablet Take 1 tablet (10 mg total) by mouth at bedtime.  . Multiple Vitamins-Minerals (CENTRUM SILVER 50+WOMEN PO) Take 1 tablet by mouth daily.   . Probiotic Product (PROBIOTIC PO) Take 1 tablet by mouth daily.  . simvastatin (ZOCOR) 20 MG tablet Take 20 mg by mouth daily at 6 PM.   . Zinc Acetate 25 MG CAPS Take 25 mg by mouth daily.   Immunization History  Administered Date(s) Administered  . Fluad Quad(high Dose 65+) 06/18/2019, 07/09/2019  . Influenza, High Dose Seasonal PF 06/21/2018  . Influenza-Unspecified 07/05/2015, 06/03/2016, 06/25/2017, 06/13/2020  . PFIZER SARS-COV-2 Vaccination 12/22/2019, 01/12/2020  . Pneumococcal Conjugate-13 01/11/2016  . Pneumococcal Polysaccharide-23 06/25/2017  . Tdap 03/23/2016       Objective:   Physical Exam BP 122/82 (BP Location: Left Arm, Cuff Size: Normal)   Pulse 78   Temp (!) 97 F (36.1 C) (Temporal)  Ht 4\' 9"  (1.448 m)   Wt 154 lb (69.9 kg)   SpO2 98%   BMI 33.33 kg/m  GENERAL: Well-developed well-nourished woman, no acute distress, well-groomed.  Fully ambulatory.  No conversational dyspnea. HEAD: Normocephalic, atraumatic.  EYES: Pupils equal, round, reactive to light.  No scleral icterus.  MOUTH: Nose/mouth/throat not examined due to masking requirements for COVID 19. NECK: Supple. No thyromegaly. Trachea midline. No JVD.  No adenopathy. PULMONARY: Good air entry bilaterally.  Lungs clear to  auscultation bilaterally. CARDIOVASCULAR: S1 and S2. Regular rate and rhythm.  No rubs, murmurs or gallops heard. GASTROINTESTINAL: Benign. MUSCULOSKELETAL: No joint deformity, no clubbing, no edema.  NEUROLOGIC: No focal deficit, no gait disturbance, speech is fluent. SKIN: Intact,warm,dry. PSYCH: Mood and behavior normal   Chest x-ray from 04 April 2020, independently reviewed:      Assessment & Plan:     ICD-10-CM   1. 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 Interstitial pulmonary disease (HCC)  J84.9 CT Chest High Resolution   Suggested by PFTs CT high resolution  3. Asthma due to environmental allergies  J45.909    Replacing Breo Ellipta for Arnuity  4. Gastroesophageal reflux disease without esophagitis  K21.9    Continue PPI This is likely aggravating her cough issues   Orders Placed This Encounter  Procedures  . CT Chest High Resolution    Standing Status:   Future    Standing Expiration Date:   07/05/2021    Scheduling Instructions:     Next available.    Order Specific Question:   Preferred imaging location?    Answer:   [facility]  . Alpha-1 antitrypsin phenotype    Standing Status:   Future    Standing Expiration Date:   07/05/2021   Meds ordered this encounter  Medications  . Fluticasone Furoate (ARNUITY ELLIPTA) 200 MCG/ACT AEPB    Sig: Inhale 1 puff into the lungs daily.    Dispense:  30 each    Refill:  11    Discontinue Breo.  . benzonatate (TESSALON) 200 MG capsule    Sig: Take 1 capsule (200 mg total) by mouth at bedtime.    Dispense:  30 capsule    Refill:  6   Patient has had issues with cough of longstanding.  Data so far supporting asthma however patient does have diffusion capacity impairment which may be indicative of interstitial lung disease, will obtain high  resolution CT scan of the chest.  In addition we will obtain alpha-1 antitrypsin phenotype.  Will transition to Arnuity and discontinue Breo.  And if it from [facility] when she gets into coughing spells.  We will renew this.  We will see her in follow-up in 6 to 8 weeks time she is to contact [facility] prior to that time should any new difficulties arise.  I have recommended evaluation at the voice disorder center at [facility] if her issues with cough persist.  C. [facility], MD [place] [facility]   *This note was dictated using voice recognition software/Dragon.  Despite best efforts to proofread, errors can occur which can change the meaning.  Any change was purely unintentional.

## 2020-07-05 NOTE — Patient Instructions (Signed)
We are going to get a chest CT to evaluate for potential scarring in the lung.  We have sent a prescription for Tessalon.   We have sent a prescription for Arnuity which will replace Breo.   We will see him in follow-up in 6 to 8 weeks time call sooner should any new problems arise.

## 2020-07-06 ENCOUNTER — Other Ambulatory Visit
Admission: RE | Admit: 2020-07-06 | Discharge: 2020-07-06 | Disposition: A | Discharge status: Home / Self Care | Payer: Medicare HMO | Attending: Pulmonary Disease | Admitting: Pulmonary Disease

## 2020-07-06 ENCOUNTER — Telehealth: Payer: Self-pay | Admitting: Pulmonary Disease

## 2020-07-06 DIAGNOSIS — R053 Chronic cough: Secondary | ICD-10-CM | POA: Diagnosis present

## 2020-07-06 NOTE — Telephone Encounter (Signed)
error 

## 2020-07-08 LAB — ALPHA-1 ANTITRYPSIN PHENOTYPE: A-1 Antitrypsin, Ser: 136 mg/dL (ref 101–187)

## 2020-07-15 ENCOUNTER — Other Ambulatory Visit: Payer: Self-pay

## 2020-07-15 ENCOUNTER — Ambulatory Visit
Admission: RE | Admit: 2020-07-15 | Discharge: 2020-07-15 | Disposition: A | Discharge status: Home / Self Care | Payer: Medicare HMO | Source: Ambulatory Visit | Attending: Pulmonary Disease | Admitting: Pulmonary Disease

## 2020-07-15 DIAGNOSIS — J849 Interstitial pulmonary disease, unspecified: Secondary | ICD-10-CM | POA: Diagnosis present

## 2020-07-19 ENCOUNTER — Other Ambulatory Visit: Payer: Self-pay

## 2020-07-19 DIAGNOSIS — J849 Interstitial pulmonary disease, unspecified: Secondary | ICD-10-CM

## 2020-08-01 ENCOUNTER — Ambulatory Visit
Admission: RE | Admit: 2020-08-01 | Discharge: 2020-08-01 | Disposition: A | Discharge status: Home / Self Care | Payer: Medicare HMO | Source: Ambulatory Visit | Attending: Family Medicine | Admitting: Family Medicine

## 2020-08-01 ENCOUNTER — Other Ambulatory Visit: Payer: Self-pay

## 2020-08-01 DIAGNOSIS — Z1231 Encounter for screening mammogram for malignant neoplasm of breast: Secondary | ICD-10-CM | POA: Diagnosis present

## 2020-10-16 ENCOUNTER — Other Ambulatory Visit: Payer: Self-pay | Admitting: Primary Care

## 2020-12-21 ENCOUNTER — Encounter: Payer: Self-pay | Admitting: Pulmonary Disease

## 2021-01-03 ENCOUNTER — Ambulatory Visit (INDEPENDENT_AMBULATORY_CARE_PROVIDER_SITE_OTHER): Payer: Medicare HMO | Admitting: Pulmonary Disease

## 2021-01-03 ENCOUNTER — Other Ambulatory Visit: Payer: Self-pay

## 2021-01-03 ENCOUNTER — Encounter: Payer: Self-pay | Admitting: Pulmonary Disease

## 2021-01-03 ENCOUNTER — Other Ambulatory Visit
Admission: RE | Admit: 2021-01-03 | Discharge: 2021-01-03 | Disposition: A | Discharge status: Home / Self Care | Payer: Medicare HMO | Source: Ambulatory Visit | Attending: Pulmonary Disease | Admitting: Pulmonary Disease

## 2021-01-03 VITALS — BP 122/78 | HR 75 | Temp 97.3°F | Ht <= 58 in | Wt 154.0 lb

## 2021-01-03 DIAGNOSIS — J45909 Unspecified asthma, uncomplicated: Secondary | ICD-10-CM | POA: Insufficient documentation

## 2021-01-03 DIAGNOSIS — K219 Gastro-esophageal reflux disease without esophagitis: Secondary | ICD-10-CM

## 2021-01-03 DIAGNOSIS — R053 Chronic cough: Secondary | ICD-10-CM | POA: Diagnosis not present

## 2021-01-03 LAB — CBC WITH DIFFERENTIAL/PLATELET
Abs Immature Granulocytes: 0.02 10*3/uL (ref 0.00–0.07)
Basophils Absolute: 0.1 10*3/uL (ref 0.0–0.1)
Basophils Relative: 1 %
Eosinophils Absolute: 0.2 10*3/uL (ref 0.0–0.5)
Eosinophils Relative: 4 %
HCT: 38.7 % (ref 36.0–46.0)
Hemoglobin: 13.3 g/dL (ref 12.0–15.0)
Immature Granulocytes: 0 %
Lymphocytes Relative: 28 %
Lymphs Abs: 1.7 10*3/uL (ref 0.7–4.0)
MCH: 32.3 pg (ref 26.0–34.0)
MCHC: 34.4 g/dL (ref 30.0–36.0)
MCV: 93.9 fL (ref 80.0–100.0)
Monocytes Absolute: 0.6 10*3/uL (ref 0.1–1.0)
Monocytes Relative: 10 %
Neutro Abs: 3.5 10*3/uL (ref 1.7–7.7)
Neutrophils Relative %: 57 %
Platelets: 236 10*3/uL (ref 150–400)
RBC: 4.12 MIL/uL (ref 3.87–5.11)
RDW: 12.8 % (ref 11.5–15.5)
WBC: 6.2 10*3/uL (ref 4.0–10.5)
nRBC: 0 % (ref 0.0–0.2)

## 2021-01-03 MED ORDER — SPIRIVA RESPIMAT 1.25 MCG/ACT IN AERS: 2.0000 | INHALATION_SPRAY | Freq: Every day | RESPIRATORY_TRACT | 0 refills | Status: DC

## 2021-01-03 NOTE — Progress Notes (Signed)
Subjective:    Patient ID: Jennifer Farmer, female    DOB: April 18, 1946, 75 y.o.   MRN: [id number]  HPI Jennifer Farmer is a 75 year old very remote former smoker who follows here for the issue of chronic cough.  She appears to have seasonal worsening of her symptoms.  She has tried [employer] but does not feel that is any different from inhaled corticosteroid alone.  She also has difficulties with beta agonist because they cause palpitations.  Is currently on Arnuity Ellipta for control.   Since her last visit here on 05 July 2020 she has not had  any fevers, chills or sweats.    Her cough is actually fairly well controlled she noticed some increased cough during the start of the allergy season.  She does have persistent issues with gastroesophageal reflux.  She follows with GI at [facility].    She does not endorse any other symptomatology today.  Her cough remains nonproductive.  No hemoptysis.  She does not find that her cough is debilitating as it was previously.  Overall she feels well and looks well.  DATA: 03/16/20 PFTs: FVC 2.11 (84%), FEV1 1.77 (92%), ratio 84, DLCO unc 8.81 (50%) Interpretation: No Restriction or obstruction. No BD response. Moderate-severe diffusion defect 03/14/20 CXR: Subtle density in the right upper lobe, early infection is not excluded 07/15/2020 chest CT high resolution: Mild groundglass opacities indeterminate for early NSIP or UIP.  Will need follow-up CT which has been ordered   Review of Systems A 10 point review of systems was performed and it is as noted above otherwise negative.  Patient Active Problem List   Diagnosis Date Noted  . Bronchitis 04/04/2020  . Asthma due to seasonal allergies 04/04/2020  . Gastroesophageal reflux disease without esophagitis 07/04/2017  . Obesity (BMI 30.0-34.9) 07/04/2017  . Pure hypercholesterolemia 07/04/2017  . Osteopenia 07/04/2017   Social History   Tobacco Use  . Smoking status: Former Smoker    Packs/day:  1.00    Years: 10.00    Pack years: 10.00    Types: Cigarettes    Quit date: 09/03/1998    Years since quitting: 22.3  . Smokeless tobacco: Never Used  Substance Use Topics  . Alcohol use: No    Comment: rarely   Allergies  Allergen Reactions  . Adhesive [Tape] Itching    Redness    Current Meds  Medication Sig  . albuterol (VENTOLIN HFA) 108 (90 Base) MCG/ACT inhaler Inhale 2 puffs into the lungs every 4 (four) hours as needed for wheezing or shortness of breath.  . benzonatate (TESSALON) 200 MG capsule Take 1 capsule (200 mg total) by mouth at bedtime.  . Calcium Carbonate-Vitamin D 600-400 MG-UNIT tablet Take 1 tablet by mouth 2 (two) times daily.   Marland Kitchen esomeprazole (NEXIUM) 40 MG capsule Take 40 mg by mouth daily before supper.   . Fluticasone Furoate (ARNUITY ELLIPTA) 200 MCG/ACT AEPB Inhale 1 puff into the lungs daily.  . Melatonin 5 MG TABS Take 5 mg by mouth at bedtime.  . montelukast (SINGULAIR) 10 MG tablet TAKE 1 TABLET(10 MG) BY MOUTH AT BEDTIME  . Multiple Vitamins-Minerals (CENTRUM SILVER 50+WOMEN PO) Take 1 tablet by mouth daily.   . Probiotic Product (PROBIOTIC PO) Take 1 tablet by mouth daily.  . simvastatin (ZOCOR) 20 MG tablet Take 20 mg by mouth daily at 6 PM.   . Zinc Acetate 25 MG CAPS Take 25 mg by mouth daily.   Immunization History  Administered Date(s) Administered  .  Fluad Quad(high Dose 65+) 06/18/2019, 07/09/2019  . Influenza, High Dose Seasonal PF 06/21/2018  . Influenza-Unspecified 07/05/2015, 06/03/2016, 06/25/2017, 06/13/2020  . PFIZER(Purple Top)SARS-COV-2 Vaccination 12/22/2019, 01/12/2020, 07/18/2020  . Pneumococcal Conjugate-13 01/11/2016  . Pneumococcal Polysaccharide-23 06/25/2017  . Tdap 03/23/2016        Objective:   Physical Exam BP 122/78 (BP Location: Left Arm, Patient Position: Sitting, Cuff Size: Normal)   Pulse 75   Temp (!) 97.3 F (36.3 C) (Temporal)   Ht 4\' 10"  (1.473 m)   Wt 154 lb (69.9 kg)   SpO2 100%   BMI 32.19  kg/m   GENERAL: Well-developed well-nourished woman, no acute distress, well-groomed.  Fully ambulatory.  No conversational dyspnea. HEAD: Normocephalic, atraumatic.  EYES: Pupils equal, round, reactive to light. No scleral icterus.  MOUTH: Nose/mouth/throat not examined due to masking requirements for COVID 19. NECK: Supple. No thyromegaly. Trachea midline. No JVD. No adenopathy. PULMONARY: Good air entry bilaterally.  Lungs clear to auscultation bilaterally. CARDIOVASCULAR: S1 and S2. Regular rate and rhythm.  No rubs, murmurs or gallops heard. GASTROINTESTINAL: Benign. MUSCULOSKELETAL: No joint deformity, no clubbing, no edema.  NEUROLOGIC: No focal deficit, no gait disturbance, speech is fluent. SKIN: Intact,warm,dry. PSYCH: Mood and behavior normal      Assessment & Plan:     ICD-10-CM   1. Chronic cough  R05.3    Continues to do well and fairly well controlled Uses Tessalon when exacerbates   2. Asthma due to environmental allergies  J45.909 Tiotropium Bromide Monohydrate (SPIRIVA RESPIMAT) 1.25 MCG/ACT AERS    CBC w/Diff   Continue Arnuity Ellipta Not tolerant of beta agonists Will add Spiriva 1.25, 2 inhalations in the morning  3. Gastroesophageal reflux disease without esophagitis  K21.9    This issue adds complexity to her management Continue follow-up with [facility]   Overall is doing well.  We will add Spiriva during the allergy months to better control her asthma.  She is intolerant of beta agonist.  She is to continue use of Arnuity.  She feels that Arnuity has helped her.  Follow-up in 3 months time she is to contact [facility] prior to that time should any new difficulties arise.  She will have repeat CT scan of the chest in November to follow-up on the groundglass opacities previously seen.  December, MD [place] [facility]   *This note was dictated using voice recognition software/Dragon.  Despite best efforts to proofread, errors can occur which can change the  meaning.  Any change was purely unintentional.

## 2021-01-03 NOTE — Patient Instructions (Signed)
We are giving you a trial of Spiriva 1.25, 2 puffs in the morning, rinse your mouth well after use  Continue using Arnuity at bedtime  We are checking another blood count today to check for allergy cells   We will see you in follow-up in 3 months time call sooner should any new problems arise

## 2021-01-06 ENCOUNTER — Encounter: Payer: Self-pay | Admitting: Pulmonary Disease

## 2021-04-28 ENCOUNTER — Telehealth: Payer: Self-pay

## 2021-04-28 MED ORDER — MONTELUKAST SODIUM 10 MG PO TABS
ORAL_TABLET | ORAL | 5 refills | Status: DC
Start: 2021-04-28 — End: 2021-11-03

## 2021-04-28 NOTE — Telephone Encounter (Signed)
Received RX request from [facility] for Singulair. RX filled to the pharmacy nothing further needed.

## 2021-06-07 ENCOUNTER — Other Ambulatory Visit: Payer: Self-pay

## 2021-06-07 ENCOUNTER — Encounter: Payer: Self-pay | Admitting: Pulmonary Disease

## 2021-06-07 ENCOUNTER — Ambulatory Visit (INDEPENDENT_AMBULATORY_CARE_PROVIDER_SITE_OTHER): Payer: Medicare HMO | Admitting: Pulmonary Disease

## 2021-06-07 VITALS — BP 130/78 | HR 81 | Temp 98.1°F | Ht <= 58 in | Wt 155.4 lb

## 2021-06-07 DIAGNOSIS — J3089 Other allergic rhinitis: Secondary | ICD-10-CM

## 2021-06-07 DIAGNOSIS — R053 Chronic cough: Secondary | ICD-10-CM | POA: Diagnosis not present

## 2021-06-07 DIAGNOSIS — J849 Interstitial pulmonary disease, unspecified: Secondary | ICD-10-CM

## 2021-06-07 DIAGNOSIS — J302 Other seasonal allergic rhinitis: Secondary | ICD-10-CM

## 2021-06-07 DIAGNOSIS — J45909 Unspecified asthma, uncomplicated: Secondary | ICD-10-CM

## 2021-06-07 DIAGNOSIS — K219 Gastro-esophageal reflux disease without esophagitis: Secondary | ICD-10-CM | POA: Diagnosis not present

## 2021-06-07 NOTE — Progress Notes (Signed)
Subjective:    Patient ID: Jennifer Farmer, female    DOB: 02-20-1946, 75 y.o.   MRN: [id number] Chief Complaint  Patient presents with   Follow-up    Chronic Cough- patient states her cough has been good and bad,    DATA: 03/16/20 PFTs: FVC 2.11 (84%), FEV1 1.77 (92%), ratio 84, diffusion capacity by Kco 113 Interpretation: No Restriction or obstruction. No BD response. Moderate-severe diffusion defect 03/14/20 CXR: Subtle density in the right upper lobe, early infection is not excluded 07/15/2020 chest CT high resolution: Mild groundglass opacities indeterminate for early NSIP or UIP.  Follow-up CT ordered  HPI Jennifer Farmer is a 75 year old very remote former smoker who follows here for the issue of chronic cough.  She appears to have seasonal worsening of her symptoms.  She was last seen by me on 03 Jan 2021.  She has tried [employer] but does not feel that is any different from inhaled corticosteroid alone.  She also has difficulties with beta agonist because they cause palpitations.  Is currently on Arnuity Ellipta for control.   Since her last visit here she has not had  any fevers, chills or sweats.    Her cough is actually has increased again, she noticed some increased cough during the change of weather.  She does have persistent issues with gastroesophageal reflux.  She follows with GI at [facility].    She does not endorse any other symptomatology today.  Her cough remains nonproductive.  No hemoptysis.  She does not find that her cough is debilitating as it was previously.  She does note significant postnasal drip.  Also notes significant increased cough when she lays down.  Otherwise, is well and feels well.   Review of Systems A 10 point review of systems was performed and it is as noted above otherwise negative.    Objective:   Physical Exam BP 130/78 (BP Location: Left Arm, Patient Position: Sitting, Cuff Size: Normal)   Pulse 81   Temp 98.1 F (36.7 C) (Oral)   Ht 4\' 9"   (1.448 m)   Wt 155 lb 6.4 oz (70.5 kg)   SpO2 100%   BMI 33.63 kg/m  GENERAL: Well-developed well-nourished woman, no acute distress, well-groomed.  Fully ambulatory.  No conversational dyspnea. HEAD: Normocephalic, atraumatic.  EYES: Pupils equal, round, reactive to light.  No scleral icterus.  MOUTH: Nose/mouth/throat not examined due to masking requirements for COVID 19. NECK: Supple. No thyromegaly. Trachea midline. No JVD.  No adenopathy. PULMONARY: Good air entry bilaterally.  Lungs clear to auscultation bilaterally. CARDIOVASCULAR: S1 and S2. Regular rate and rhythm.  No rubs, murmurs or gallops heard. GASTROINTESTINAL: Benign. MUSCULOSKELETAL: No joint deformity, no clubbing, no edema.  NEUROLOGIC: No focal deficit, no gait disturbance, speech is fluent. SKIN: Intact,warm,dry. PSYCH: Mood and behavior normal       Assessment & Plan:     ICD-10-CM   1. Asthma due to seasonal allergies  J45.909 IgE    CBC w/Diff   Reassess allergen panel Trial of Trelegy Ellipta Hold Arnuity for now    2. Chronic cough  R05.3    Appears to have had exacerbation with change of season Allergens (ragweed)    3. Gastroesophageal reflux disease without esophagitis  K21.9    This issue adds complexity to her management Patient feels it is fairly well controlled Post with GI at [facility]    4. Interstitial pulmonary disease [facility])  J84.9    Query early Needs repeat CT November Ordered  5. Perennial allergic rhinitis with seasonal variation  J30.89    J30.2      Orders Placed This Encounter  Procedures   IgE    Standing Status:   Future    Standing Expiration Date:   06/07/2022   CBC w/Diff    Standing Status:   Future    Standing Expiration Date:   06/07/2022   Meds ordered this encounter  Medications   Fluticasone-Umeclidin-Vilant (TRELEGY ELLIPTA) 100-62.5-25 MCG/INH AEPB    Sig: Inhale 1 puff into the lungs daily.    Dispense:  28 each    Refill:  1   We will see the patient  in follow-up in 2 to 3 months time call sooner should any new problems arise.  Gailen Shelter, MD Advanced Bronchoscopy [facility]    *This note was dictated using voice recognition software/Dragon.  Despite best efforts to proofread, errors can occur which can change the meaning.  Any change was purely unintentional.

## 2021-06-07 NOTE — Patient Instructions (Signed)
Get your blood drawn sometime next week at your convenience at the laboratory.  The order is there so they can draw it at any time that is convenient to you.  We are giving you a trial of Trelegy Ellipta 1 inhalation daily you can do it at the same time you have been doing the Arnuity.  STOP the Arnuity while you are on the Trelegy.  We provided you with a coupon to get 1 months worth of Trelegy.  Let us know how that does for you.   We will see you in follow-up in 2 to 3 months time, we will call you with the results of your blood work when it is ready.

## 2021-06-09 ENCOUNTER — Encounter: Payer: Self-pay | Admitting: Pulmonary Disease

## 2021-06-09 MED ORDER — TRELEGY ELLIPTA 100-62.5-25 MCG/INH IN AEPB: 1.0000 | INHALATION_SPRAY | Freq: Every day | RESPIRATORY_TRACT | 1 refills | Status: DC

## 2021-06-15 ENCOUNTER — Other Ambulatory Visit
Admission: RE | Admit: 2021-06-15 | Discharge: 2021-06-15 | Disposition: A | Discharge status: Home / Self Care | Payer: Medicare HMO | Attending: Pulmonary Disease | Admitting: Pulmonary Disease

## 2021-06-15 DIAGNOSIS — J45909 Unspecified asthma, uncomplicated: Secondary | ICD-10-CM | POA: Diagnosis present

## 2021-06-15 LAB — CBC WITH DIFFERENTIAL/PLATELET
Abs Immature Granulocytes: 0.02 10*3/uL (ref 0.00–0.07)
Basophils Absolute: 0.1 10*3/uL (ref 0.0–0.1)
Basophils Relative: 1 %
Eosinophils Absolute: 0.2 10*3/uL (ref 0.0–0.5)
Eosinophils Relative: 3 %
HCT: 38.9 % (ref 36.0–46.0)
Hemoglobin: 13.4 g/dL (ref 12.0–15.0)
Immature Granulocytes: 0 %
Lymphocytes Relative: 27 %
Lymphs Abs: 1.6 10*3/uL (ref 0.7–4.0)
MCH: 31.9 pg (ref 26.0–34.0)
MCHC: 34.4 g/dL (ref 30.0–36.0)
MCV: 92.6 fL (ref 80.0–100.0)
Monocytes Absolute: 0.4 10*3/uL (ref 0.1–1.0)
Monocytes Relative: 7 %
Neutro Abs: 3.7 10*3/uL (ref 1.7–7.7)
Neutrophils Relative %: 62 %
Platelets: 198 10*3/uL (ref 150–400)
RBC: 4.2 MIL/uL (ref 3.87–5.11)
RDW: 13.2 % (ref 11.5–15.5)
WBC: 6.1 10*3/uL (ref 4.0–10.5)
nRBC: 0 % (ref 0.0–0.2)

## 2021-06-22 LAB — IGE: IgE (Immunoglobulin E), Serum: 84 IU/mL (ref 6–495)

## 2021-07-10 ENCOUNTER — Other Ambulatory Visit: Payer: Self-pay | Admitting: Family Medicine

## 2021-07-10 DIAGNOSIS — Z1231 Encounter for screening mammogram for malignant neoplasm of breast: Secondary | ICD-10-CM

## 2021-08-02 ENCOUNTER — Other Ambulatory Visit: Payer: Self-pay

## 2021-08-02 ENCOUNTER — Ambulatory Visit
Admission: RE | Admit: 2021-08-02 | Discharge: 2021-08-02 | Disposition: A | Discharge status: Home / Self Care | Payer: Medicare HMO | Source: Ambulatory Visit | Attending: Family Medicine | Admitting: Family Medicine

## 2021-08-02 DIAGNOSIS — Z1231 Encounter for screening mammogram for malignant neoplasm of breast: Secondary | ICD-10-CM | POA: Diagnosis not present

## 2021-08-07 ENCOUNTER — Encounter: Payer: Self-pay | Admitting: Pulmonary Disease

## 2021-08-07 ENCOUNTER — Other Ambulatory Visit: Payer: Self-pay

## 2021-08-07 ENCOUNTER — Ambulatory Visit (INDEPENDENT_AMBULATORY_CARE_PROVIDER_SITE_OTHER): Payer: Medicare HMO | Admitting: Pulmonary Disease

## 2021-08-07 VITALS — BP 122/70 | HR 76 | Temp 98.1°F | Ht <= 58 in | Wt 155.4 lb

## 2021-08-07 DIAGNOSIS — J4540 Moderate persistent asthma, uncomplicated: Secondary | ICD-10-CM | POA: Diagnosis not present

## 2021-08-07 DIAGNOSIS — R053 Chronic cough: Secondary | ICD-10-CM | POA: Diagnosis not present

## 2021-08-07 DIAGNOSIS — J849 Interstitial pulmonary disease, unspecified: Secondary | ICD-10-CM | POA: Diagnosis not present

## 2021-08-07 DIAGNOSIS — J3089 Other allergic rhinitis: Secondary | ICD-10-CM

## 2021-08-07 DIAGNOSIS — J302 Other seasonal allergic rhinitis: Secondary | ICD-10-CM

## 2021-08-07 MED ORDER — TRELEGY ELLIPTA 100-62.5-25 MCG/ACT IN AEPB: 1.0000 | INHALATION_SPRAY | Freq: Every day | RESPIRATORY_TRACT | 11 refills | Status: DC

## 2021-08-07 NOTE — Progress Notes (Signed)
Subjective:    Patient ID: Jennifer Farmer, female    DOB: August 27, 1946, 75 y.o.   MRN: [id number] Chief Complaint  Patient presents with   Follow-up    C/o dry cough and occ sob with exertion and wheezing.      HPI Patient is a 75 year old very remote former smoker who follows here for the issue of chronic cough.  She has seasonal worsening of her symptoms.  He was last seen by me on 07 June 2021.  At that time we tried her on Trelegy Ellipta and she notes that this has helped her more than any other inhaler.  She notes that she at least can sleep through the night without any issues with cough.  She has not noticed any issues with postnasal drip lately.  She does have issues with chronic rhinitis.  She has issues with gastroesophageal reflux but follows with GI at [facility] for this issue.  She is on Pepcid for reflux symptoms.  No fevers, chills or sweats.  No chest pain.  No dyspnea.  Up-to-date on flu vaccine.  Overall she feels well and looks well.  DATA: 03/16/20 PFTs: FVC 2.11 (84%), FEV1 1.77 (92%), ratio 84, diffusion capacity by Kco 113 Interpretation: No Restriction or obstruction. No BD response. Moderate-severe diffusion defect 03/14/20 CXR: Subtle density in the right upper lobe, early infection is not excluded 07/15/2020 chest CT high resolution: Mild groundglass opacities indeterminate for early NSIP or UIP.    Review of Systems A 10 point review of systems was performed and it is as noted above otherwise negative.  Patient Active Problem List   Diagnosis Date Noted   Bronchitis 04/04/2020   Asthma due to seasonal allergies 04/04/2020   Gastroesophageal reflux disease without esophagitis 07/04/2017   Obesity (BMI 30.0-34.9) 07/04/2017   Pure hypercholesterolemia 07/04/2017   Osteopenia 07/04/2017   Social History   Tobacco Use   Smoking status: Former    Packs/day: 1.00    Years: 10.00    Pack years: 10.00    Types: Cigarettes    Quit date: 09/03/1998     Years since quitting: 22.9   Smokeless tobacco: Never  Substance Use Topics   Alcohol use: No    Comment: rarely   Allergies  Allergen Reactions   Tape Itching    Redness  Redness    Current Meds  Medication Sig   albuterol (VENTOLIN HFA) 108 (90 Base) MCG/ACT inhaler Inhale 2 puffs into the lungs every 4 (four) hours as needed for wheezing or shortness of breath.   benzonatate (TESSALON) 200 MG capsule Take 1 capsule (200 mg total) by mouth at bedtime.   Calcium Carbonate-Vitamin D 600-400 MG-UNIT tablet Take 1 tablet by mouth 2 (two) times daily.    esomeprazole (NEXIUM) 40 MG capsule Take 40 mg by mouth daily before supper.    famotidine (PEPCID) 20 MG tablet TAKE 1 TABLET(20 MG) BY MOUTH EVERY NIGHT   Fluticasone-Umeclidin-Vilant (TRELEGY ELLIPTA) 100-62.5-25 MCG/INH AEPB Inhale 1 puff into the lungs daily.   Melatonin 5 MG TABS Take 5 mg by mouth at bedtime.   montelukast (SINGULAIR) 10 MG tablet TAKE 1 TABLET(10 MG) BY MOUTH AT BEDTIME   Multiple Vitamins-Minerals (CENTRUM SILVER 50+WOMEN PO) Take 1 tablet by mouth daily.    Probiotic Product (PROBIOTIC PO) Take 1 tablet by mouth daily.   simvastatin (ZOCOR) 20 MG tablet Take 20 mg by mouth daily at 6 PM.    Zinc Acetate 25 MG CAPS Take 25 mg  by mouth daily.   Immunization History  Administered Date(s) Administered   Fluad Quad(high Dose 65+) 06/18/2019, 07/09/2019   Influenza, High Dose Seasonal PF 06/21/2018   Influenza-Unspecified 07/05/2015, 06/03/2016, 06/25/2017, 06/13/2020, 07/10/2021   PFIZER(Purple Top)SARS-COV-2 Vaccination 12/22/2019, 01/12/2020, 07/18/2020   Pneumococcal Conjugate-13 01/11/2016   Pneumococcal Polysaccharide-23 06/25/2017   Tdap 03/23/2016       Objective:   Physical Exam BP 122/70 (BP Location: Left Arm, Cuff Size: Normal)   Pulse 76   Temp 98.1 F (36.7 C) (Oral)   Ht 4\' 9"  (1.448 m)   Wt 155 lb 6.4 oz (70.5 kg)   SpO2 98%   BMI 33.63 kg/m  GENERAL: Well-developed well-nourished  woman, no acute distress, well-groomed.  Fully ambulatory.  No conversational dyspnea. HEAD: Normocephalic, atraumatic.  EYES: Pupils equal, round, reactive to light.  No scleral icterus.  MOUTH: Nose/mouth/throat not examined due to masking requirements for COVID 19. NECK: Supple. No thyromegaly. Trachea midline. No JVD.  No adenopathy. PULMONARY: Good air entry bilaterally.  Lungs clear to auscultation bilaterally. CARDIOVASCULAR: S1 and S2. Regular rate and rhythm.  No rubs, murmurs or gallops heard. GASTROINTESTINAL: Benign. MUSCULOSKELETAL: No joint deformity, no clubbing, no edema.  NEUROLOGIC: No focal deficit, no gait disturbance, speech is fluent. SKIN: Intact,warm,dry. PSYCH: Mood and behavior normal     Assessment & Plan:     ICD-10-CM   1. Asthmatic bronchitis, moderate persistent, uncomplicated  J45.40    Continue Trelegy Ellipta 100/62.5/25, 1 puff daily Follow-up 4 months time call sooner should any new problems arise    2. Chronic cough  R05.3    Markedly improved on Trelegy Continue Trelegy    3. Interstitial pulmonary disease (HCC)  J84.9    Did not get CT ordered Will order on follow-up appointment    4. Perennial allergic rhinitis with seasonal variation  J30.89    J30.2    States well controlled at present Nasal saline washes Singulair     Meds ordered this encounter  Medications   Fluticasone-Umeclidin-Vilant (TRELEGY ELLIPTA) 100-62.5-25 MCG/ACT AEPB    Sig: Inhale 1 puff into the lungs daily.    Dispense:  28 each    Refill:  11   We will see the patient in follow-up in 4 months time she is to call sooner should any new problems arise.  10-02-1985, MD Advanced Bronchoscopy [facility] [place]    *This note was dictated using voice recognition software/Dragon.  Despite best efforts to proofread, errors can occur which can change the meaning.  Any change was purely unintentional.

## 2021-08-07 NOTE — Patient Instructions (Signed)
We will see in follow-up in 4 months time call sooner should any new problems arise.

## 2021-11-03 ENCOUNTER — Other Ambulatory Visit: Payer: Self-pay

## 2021-11-03 MED ORDER — MONTELUKAST SODIUM 10 MG PO TABS: ORAL_TABLET | ORAL | 5 refills | Status: DC

## 2022-01-30 DIAGNOSIS — J4540 Moderate persistent asthma, uncomplicated: Secondary | ICD-10-CM | POA: Insufficient documentation

## 2022-05-09 ENCOUNTER — Other Ambulatory Visit: Payer: Self-pay | Admitting: Pulmonary Disease

## 2022-07-10 ENCOUNTER — Ambulatory Visit: Payer: Medicare HMO

## 2022-07-11 ENCOUNTER — Telehealth: Payer: Self-pay | Admitting: Pulmonary Disease

## 2022-07-11 DIAGNOSIS — R053 Chronic cough: Secondary | ICD-10-CM

## 2022-07-11 DIAGNOSIS — J4540 Moderate persistent asthma, uncomplicated: Secondary | ICD-10-CM

## 2022-07-11 MED ORDER — AZITHROMYCIN 250 MG PO TABS: ORAL_TABLET | ORAL | 0 refills | Status: AC

## 2022-07-11 MED ORDER — METHYLPREDNISOLONE 4 MG PO TBPK: ORAL_TABLET | ORAL | 0 refills | Status: DC

## 2022-07-11 MED ORDER — ALBUTEROL SULFATE (2.5 MG/3ML) 0.083% IN NEBU: 2.5000 mg | INHALATION_SOLUTION | Freq: Four times a day (QID) | RESPIRATORY_TRACT | 2 refills | Status: AC | PRN

## 2022-07-11 NOTE — Telephone Encounter (Signed)
Patient is aware of below message/recommendations. Zpak, medrol and albuterol has been sent to preferred pharmacy.  Nebulizer ordered.  Nothing further needed.

## 2022-07-11 NOTE — Telephone Encounter (Signed)
If COVID-negative, can send in methylprednisolone pack and Azithromycin Z-Pak.  Can also order nebulizer machine with albuterol solution for as needed use every 6 hours as needed.

## 2022-07-11 NOTE — Telephone Encounter (Signed)
Called and spoke to patient. She stated that she is currently experiencing asthma flare. C/o prod cough with white mucus, headache, SOB with exertion and laying flat, wheezing Denied fever, chills, sweats or additional sx.  No recent covid. She will test and call back with results.  She using trelegy once daily. Not using albuterol. She was not aware that both inhalers could be taken together.  She is requesting a nebulizer machine and medication.   Dr. Jayme Cloud, please advise. Thanks

## 2022-07-11 NOTE — Telephone Encounter (Signed)
Pt called back stating that she did the covid test and the results came back negative. Routing to Dr. Reece Agar.

## 2022-07-11 NOTE — Telephone Encounter (Signed)
Will wait for patient to call back with results.

## 2022-07-17 ENCOUNTER — Other Ambulatory Visit: Payer: Self-pay | Admitting: Family Medicine

## 2022-07-17 DIAGNOSIS — Z1231 Encounter for screening mammogram for malignant neoplasm of breast: Secondary | ICD-10-CM

## 2022-07-18 ENCOUNTER — Encounter: Payer: Self-pay | Admitting: Pulmonary Disease

## 2022-07-18 ENCOUNTER — Ambulatory Visit (INDEPENDENT_AMBULATORY_CARE_PROVIDER_SITE_OTHER): Payer: Medicare HMO | Admitting: Pulmonary Disease

## 2022-07-18 VITALS — BP 130/80 | HR 75 | Temp 97.9°F | Ht <= 58 in | Wt 151.6 lb

## 2022-07-18 DIAGNOSIS — J4540 Moderate persistent asthma, uncomplicated: Secondary | ICD-10-CM | POA: Diagnosis not present

## 2022-07-18 DIAGNOSIS — K219 Gastro-esophageal reflux disease without esophagitis: Secondary | ICD-10-CM | POA: Diagnosis not present

## 2022-07-18 DIAGNOSIS — J849 Interstitial pulmonary disease, unspecified: Secondary | ICD-10-CM | POA: Diagnosis not present

## 2022-07-18 DIAGNOSIS — R053 Chronic cough: Secondary | ICD-10-CM

## 2022-07-18 LAB — NITRIC OXIDE: Nitric Oxide: 9

## 2022-07-18 MED ORDER — TRELEGY ELLIPTA 100-62.5-25 MCG/ACT IN AEPB: 1.0000 | INHALATION_SPRAY | Freq: Every day | RESPIRATORY_TRACT | 0 refills | Status: DC

## 2022-07-18 NOTE — Patient Instructions (Addendum)
Take your Trelegy inhaler daily.  1 puff daily, make sure you rinse your mouth well after you use it.  I think the main driver of your cough is the reflux.  This also makes you prone to frequent infections.  I do not think that Fosamax is the best medication for you given your reflux.  We are going to repeat breathing tests.  We will see you in follow-up in 4 to 6 weeks time.  Let us know how you do with the Trelegy if you are doing well with it will call the prescription into your pharmacy.

## 2022-07-18 NOTE — Progress Notes (Unsigned)
Subjective:    Patient ID: Jennifer Farmer, female    DOB: July 24, 1946, 76 y.o.   MRN: [id number] Patient Care Team: Marisue Ivan, MD as PCP - General [facility])  Chief Complaint  Patient presents with   Follow-up    No SOB. Dry cough. Coughs at night a lot.   HPI Jennifer Farmer is a 76 year old remote former smoker whom we last saw in December 2022.  He had been evaluating her for chronic cough this is believed to be due to asthma/asthmatic bronchitis.  She was told to continue Trelegy Ellipta at her last visit.  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  She was instructed to follow-up in 4 months time however got lost to follow-up until today.  Today she presents stating that she has developed a dry cough again particularly at nighttime.  This started around the change of the season.  Recall that she has sensitivity to ragweed.  For reasons that are not well understood the patient has discontinued daily use of Trelegy and uses it "as needed" she was written for a prescription for albuterol nebulizer solution by primary care however, she does not have a nebulizer.  He has not had any fevers, chills or sweats, chest pain.  He has had however increased issues with gastroesophageal reflux to the point of regurgitating at nighttime.  She notes that her cough is worse at night.  She has been on Nexium, recently switched to Dexilant, and Pepcid for her gastroesophageal reflux but even with this she has difficulties.  She has been referred to GI by her primary care physician.  Note she is on Fosamax for osteoporosis.   DATA: 03/16/20 PFTs: FVC 2.11 (84%), FEV1 1.77 (92%), ratio 84, diffusion capacity by Kco 113 Interpretation: No Restriction or obstruction. No BD response. Moderate-severe diffusion defect 03/14/20 CXR: Subtle density in the right upper lobe, early infection is not excluded 07/15/2020 chest CT high  resolution: Mild groundglass opacities indeterminate for early NSIP or UIP.  Review of Systems A 10 point review of systems was performed and it is as noted above otherwise negative.  Patient Active Problem List   Diagnosis Date Noted   Bronchitis 04/04/2020   Asthma due to seasonal allergies 04/04/2020   Gastroesophageal reflux disease without esophagitis 07/04/2017   Obesity (BMI 30.0-34.9) 07/04/2017   Pure hypercholesterolemia 07/04/2017   Osteopenia 07/04/2017   Social History   Tobacco Use   Smoking status: Former    Packs/day: 1.00    Years: 10.00    Total pack years: 10.00    Types: Cigarettes    Quit date: 09/03/1998    Years since quitting: 23.9   Smokeless tobacco: Never  Substance Use Topics   Alcohol use: No    Comment: rarely   Allergies  Allergen Reactions   Tape Itching    Redness     Current Meds  Medication Sig   albuterol (PROVENTIL) (2.5 MG/3ML) 0.083% nebulizer solution Take 3 mLs (2.5 mg total) by nebulization every 6 (six) hours as needed for wheezing or shortness of breath.   albuterol (VENTOLIN HFA) 108 (90 Base) MCG/ACT inhaler Inhale 2 puffs into the lungs every 4 (four) hours as needed for wheezing or shortness of breath.   alendronate (FOSAMAX) 70 MG tablet Take by mouth.  Take 1 tablet (70 mg total) by mouth every 7 (seven) days Take with a full glass of water. Do not lie down for the next  30 min.   benzonatate (TESSALON) 200 MG capsule Take 1 capsule (200 mg total) by mouth at bedtime.   Calcium Carbonate-Vitamin D 600-400 MG-UNIT tablet Take 1 tablet by mouth 2 (two) times daily.    esomeprazole (NEXIUM) 40 MG capsule Take 40 mg by mouth daily before supper.    famotidine (PEPCID) 20 MG tablet TAKE 1 TABLET(20 MG) BY MOUTH EVERY NIGHT   Fluticasone-Umeclidin-Vilant (TRELEGY ELLIPTA) 100-62.5-25 MCG/ACT AEPB Inhale 1 puff into the lungs daily.   Melatonin 5 MG TABS Take 5 mg by mouth at bedtime.   montelukast (SINGULAIR) 10 MG tablet TAKE 1  TABLET(10 MG) BY MOUTH AT BEDTIME   Multiple Vitamins-Minerals (CENTRUM SILVER 50+WOMEN PO) Take 1 tablet by mouth daily.    Probiotic Product (PROBIOTIC PO) Take 1 tablet by mouth daily.   simvastatin (ZOCOR) 20 MG tablet Take 20 mg by mouth daily at 6 PM.    Zinc Acetate 25 MG CAPS Take 25 mg by mouth daily.   [DISCONTINUED] Fluticasone-Umeclidin-Vilant (TRELEGY ELLIPTA) 100-62.5-25 MCG/ACT AEPB Inhale 1 puff into the lungs daily.   [DISCONTINUED] Fluticasone-Umeclidin-Vilant (TRELEGY ELLIPTA) 100-62.5-25 MCG/INH AEPB Inhale 1 puff into the lungs daily.   Immunization History  Administered Date(s) Administered   Fluad Quad(high Dose 65+) 06/18/2019, 07/09/2019   Influenza, High Dose Seasonal PF 06/21/2018   Influenza-Unspecified 07/05/2015, 06/03/2016, 06/25/2017, 06/13/2020, 07/10/2021   PFIZER(Purple Top)SARS-COV-2 Vaccination 12/22/2019, 01/12/2020, 07/18/2020   Pneumococcal Conjugate-13 01/11/2016   Pneumococcal Polysaccharide-23 06/25/2017   Tdap 03/23/2016        Objective:   Physical Exam BP 130/80 (BP Location: Left Arm, Cuff Size: Normal)   Pulse 75   Temp 97.9 F (36.6 C)   Ht 4\' 9"  (1.448 m)   Wt 151 lb 9.6 oz (68.8 kg)   SpO2 95%   BMI 32.81 kg/m  GENERAL: Well-developed well-nourished woman, no acute distress, well-groomed.  Fully ambulatory.  No conversational dyspnea. HEAD: Normocephalic, atraumatic.  EYES: Pupils equal, round, reactive to light.  No scleral icterus.  MOUTH: Dentition intact, oral mucosa moist.  Tongue not coated. NECK: Supple. No thyromegaly. Trachea midline. No JVD.  No adenopathy. PULMONARY: Good air entry bilaterally.  Coarse with some rare faint end expiratory wheeze. CARDIOVASCULAR: S1 and S2. Regular rate and rhythm.  No rubs, murmurs or gallops heard. GASTROINTESTINAL: Benign. MUSCULOSKELETAL: No joint deformity, no clubbing, no edema.  NEUROLOGIC: No focal deficit, no gait disturbance, speech is fluent. SKIN:  Intact,warm,dry. PSYCH: Mood and behavior normal  Lab Results  Component Value Date   NITRICOXIDE 9 07/18/2022       Assessment & Plan:     ICD-10-CM   1. Asthmatic bronchitis, moderate persistent, uncomplicated  J45.40 Nitric oxide    Pulmonary Function Test [facility] Only   Advised to use Trelegy DAILYand consistently Will order nebulizer for her albuterol solution    2. Chronic cough  R05.3    Multifactorial Poorly controlled asthmatic bronchitis GERD is likely main driver    3. Gastroesophageal reflux disease without esophagitis  K21.9    Poorly controlled Has consult placed to GI by primary care    4. Interstitial pulmonary disease (HCC)  J84.9    Ill characterized Will repeat PFTs May need repeat high-resolution CT     Orders Placed This Encounter  Procedures   Nitric oxide   Pulmonary Function Test [facility] Only    Standing Status:   Future    Standing Expiration Date:   07/19/2023    Order Specific Question:   Full PFT:  includes the following: basic spirometry, spirometry pre & post bronchodilator, diffusion capacity (DLCO), lung volumes    Answer:   Full PFT    Order Specific Question:   This test can only be performed at    Answer:   [facility]   Meds ordered this encounter  Medications   Fluticasone-Umeclidin-Vilant (TRELEGY ELLIPTA) 100-62.5-25 MCG/ACT AEPB    Sig: Inhale 1 puff into the lungs daily.    Dispense:  28 each    Refill:  0    Order Specific Question:   Lot Number?    Answer:   [id number]    Order Specific Question:   Expiration Date?    Answer:   12/03/2023    Order Specific Question:   Quantity    Answer:   2   Patient returns to the office after a hiatus of nearly a year.  She has had issues with recurrent cough however is not using Trelegy consistently if anything rarely at all.  She will benefit from nebulizer therapy and supplies for when she has issues with exacerbations.  Currently I believe that the cough is mainly driven by her poorly  controlled gastroesophageal reflux.  Has an upcoming GI evaluation.  She is currently maximally treated in this regard.  I suspect that Fosamax is aggravating a lot of her GI symptoms.  She will discuss this with her primary care physician and GI.  Will obtain PFTs to reassess pulmonary function.  Pending the results of the PFTs we may need to reconsider repeating high-resolution CT.  Will see the patient in follow-up in 4 to 6 weeks time she is to contact us prior to that time should any new difficulties arise.  Jennifer Shelter, MD Advanced Bronchoscopy PCCM [facility] [facility]    *This note was dictated using voice recognition software/Dragon.  Despite best efforts to proofread, errors can occur which can change the meaning. Any transcriptional errors that result from this process are unintentional and may not be fully corrected at the time of dictation.

## 2022-07-30 ENCOUNTER — Telehealth: Payer: Self-pay | Admitting: Pulmonary Disease

## 2022-07-30 ENCOUNTER — Other Ambulatory Visit: Payer: Self-pay | Admitting: Pulmonary Disease

## 2022-07-30 DIAGNOSIS — R053 Chronic cough: Secondary | ICD-10-CM

## 2022-07-30 DIAGNOSIS — J4540 Moderate persistent asthma, uncomplicated: Secondary | ICD-10-CM

## 2022-07-30 MED ORDER — PROMETHAZINE-DM 6.25-15 MG/5ML PO SYRP: 5.0000 mL | ORAL_SOLUTION | Freq: Four times a day (QID) | ORAL | 0 refills | Status: DC | PRN

## 2022-07-30 NOTE — Telephone Encounter (Signed)
[employer] will need Dr. Georgann Housekeeper completed note

## 2022-07-30 NOTE — Telephone Encounter (Signed)
During her most recent visit we discussed that I think that reflux is the main driver for her cough.  She needs to be reevaluated by GI.  I recommend that she get an antireflux (wedge pillow) this may help with reflux at night.  I think also it would be advisable to going ahead and order another high-resolution CT chest to make sure nothing else is going on.  She is to keep taking the Trelegy due to her asthmatic bronchitis.  We can send in some Phenergan DM 1 teaspoon every 4-6 hours as needed for cough.

## 2022-07-30 NOTE — Telephone Encounter (Signed)
Patient is aware of recommendations and voiced her understanding.  CT ordered.   Dr. Jayme Cloud, please order phenergan. Preferred pharmacy is [facility].

## 2022-07-30 NOTE — Telephone Encounter (Signed)
Called and spoke to patient. C/o dry cough. Cough worsens at night and interferes with her sleep. She is using Trelegy once daily but is unsure if this is effective for cough. She has tried promethazine dm and tessalon in the past and it was not effective.  Denied f/c/s or additional sx. SOB is baseline. Order placed for nebulizer machine 07/11/2022. She would told by [facility] that OV note is needed in order to provide her with machine.  Dr. Jayme Cloud, please advise. Thanks

## 2022-07-31 NOTE — Telephone Encounter (Signed)
Promethazine has been sent to pharmacy.

## 2022-08-01 ENCOUNTER — Encounter: Payer: Self-pay | Admitting: Pulmonary Disease

## 2022-08-01 DIAGNOSIS — M810 Age-related osteoporosis without current pathological fracture: Secondary | ICD-10-CM | POA: Insufficient documentation

## 2022-08-01 NOTE — Telephone Encounter (Signed)
Sent CM to [employer] that the note has been completed so they can process [employer]

## 2022-08-02 NOTE — Addendum Note (Signed)
Addended by: Bonney Leitz on: 08/02/2022 04:17 PM   Modules accepted: Orders

## 2022-08-03 ENCOUNTER — Ambulatory Visit
Admission: RE | Admit: 2022-08-03 | Discharge: 2022-08-03 | Disposition: A | Discharge status: Home / Self Care | Payer: Medicare HMO | Source: Ambulatory Visit | Attending: Family Medicine | Admitting: Family Medicine

## 2022-08-03 DIAGNOSIS — R053 Chronic cough: Secondary | ICD-10-CM | POA: Diagnosis not present

## 2022-08-06 ENCOUNTER — Other Ambulatory Visit: Payer: Self-pay | Admitting: Pulmonary Disease

## 2022-08-17 NOTE — Progress Notes (Signed)
2021* 

## 2022-08-17 NOTE — Progress Notes (Signed)
High-resolution CAT scan shows similar appearance of scarring to her lungs. No acute findings. I have not seen this patient since 2001, Dr. Jayme Cloud has seen multiple times in office since.  She has a follow-up in January make sure she keeps. She had incidental findings aortic atherosclerosis she should follow up with PCP or cardiology-non-urgent.

## 2022-09-11 ENCOUNTER — Ambulatory Visit: Payer: Medicare HMO | Attending: Pulmonary Disease

## 2022-09-11 DIAGNOSIS — Z7951 Long term (current) use of inhaled steroids: Secondary | ICD-10-CM | POA: Insufficient documentation

## 2022-09-11 DIAGNOSIS — Z87891 Personal history of nicotine dependence: Secondary | ICD-10-CM | POA: Diagnosis not present

## 2022-09-11 DIAGNOSIS — J4540 Moderate persistent asthma, uncomplicated: Secondary | ICD-10-CM | POA: Diagnosis present

## 2022-09-11 LAB — PULMONARY FUNCTION TEST ARMC ONLY
DL/VA % pred: 67 %
DL/VA: 2.85 ml/min/mmHg/L
DLCO unc % pred: 59 %
DLCO unc: 9.84 ml/min/mmHg
FEF 25-75 Post: 2.09 L/sec
FEF 25-75 Pre: 2.07 L/sec
FEF2575-%Change-Post: 1 %
FEF2575-%Pred-Post: 150 %
FEF2575-%Pred-Pre: 148 %
FEV1-%Change-Post: 3 %
FEV1-%Pred-Post: 114 %
FEV1-%Pred-Pre: 111 %
FEV1-Post: 1.96 L
FEV1-Pre: 1.9 L
FEV1FVC-%Change-Post: 2 %
FEV1FVC-%Pred-Pre: 109 %
FEV6-%Change-Post: 1 %
FEV6-%Pred-Post: 108 %
FEV6-%Pred-Pre: 107 %
FEV6-Post: 2.35 L
FEV6-Pre: 2.33 L
FEV6FVC-%Pred-Post: 105 %
FEV6FVC-%Pred-Pre: 105 %
FVC-%Change-Post: 1 %
FVC-%Pred-Post: 102 %
FVC-%Pred-Pre: 101 %
FVC-Post: 2.35 L
FVC-Pre: 2.33 L
Post FEV1/FVC ratio: 83 %
Post FEV6/FVC ratio: 100 %
Pre FEV1/FVC ratio: 81 %
Pre FEV6/FVC Ratio: 100 %
RV % pred: 102 %
RV: 2.16 L
TLC % pred: 96 %
TLC: 4.29 L

## 2022-09-11 MED ORDER — ALBUTEROL SULFATE (2.5 MG/3ML) 0.083% IN NEBU
2.5000 mg | INHALATION_SOLUTION | Freq: Once | RESPIRATORY_TRACT | Status: AC
  Administered 2022-09-11: 2.5 mg via RESPIRATORY_TRACT
  Filled 2022-09-11: qty 3

## 2022-09-18 ENCOUNTER — Ambulatory Visit (INDEPENDENT_AMBULATORY_CARE_PROVIDER_SITE_OTHER): Payer: Medicare HMO | Admitting: Pulmonary Disease

## 2022-09-18 ENCOUNTER — Encounter: Payer: Self-pay | Admitting: Pulmonary Disease

## 2022-09-18 VITALS — BP 118/78 | HR 63 | Temp 98.3°F | Ht <= 58 in | Wt 156.6 lb

## 2022-09-18 DIAGNOSIS — I2584 Coronary atherosclerosis due to calcified coronary lesion: Secondary | ICD-10-CM

## 2022-09-18 DIAGNOSIS — J8489 Other specified interstitial pulmonary diseases: Secondary | ICD-10-CM | POA: Diagnosis not present

## 2022-09-18 DIAGNOSIS — I2510 Atherosclerotic heart disease of native coronary artery without angina pectoris: Secondary | ICD-10-CM | POA: Diagnosis not present

## 2022-09-18 DIAGNOSIS — J4540 Moderate persistent asthma, uncomplicated: Secondary | ICD-10-CM | POA: Diagnosis not present

## 2022-09-18 LAB — NITRIC OXIDE

## 2022-09-18 NOTE — Progress Notes (Signed)
Subjective:    Patient ID: Jennifer Farmer, female    DOB: Mar 03, 1946, 76 y.o.   MRN: [id number] Patient Care Team: Dion Body, MD as PCP - General [facility])  Chief Complaint  Patient presents with   Follow-up    No SOB or wheezing. Dry cough that wakes her up at night.   HPI Jennifer Farmer is a 77 year old remote former smoker whom we last saw 18 July 2022.  She has a issues with chronic cough this is believed to be due to asthma/asthmatic bronchitis. 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  During her last visit she had been erratic with the use of Trelegy and was using it only as needed and only sporadically.  She was instructed to start using the medication regularly and since then her cough has almost completely resolved.  We reevaluated her pulmonary function which has actually improved as noted below, we also repeated high-resolution CT chest which shows some possible changes of NSIP however overall unchanged from prior with no progression and no evidence of UIP.  She has not had any fevers, chills or sweats, no chest pain.  Since her prior visit her reflux has also improved she is on Nexium 40 mg in the morning and famotidine 20 mg in the evening.  Because of persistent gastroesophageal reflux symptoms she was switched from Fosamax to Reclast.  She is followed by GI for her reflux issues.  We discussed the results of her most recent CT scan and PFTs.  CT chest also did show some coronary artery calcifications and the patient would like referral to cardiology as she is concerned about this finding.  DATA: 03/16/20 PFTs: FVC 2.11 (84%), FEV1 1.77 (92%), ratio 84, diffusion capacity by Kco 113 Interpretation: No Restriction or obstruction. No BD response. Moderate-severe diffusion defect 03/14/20 CXR: Subtle density in the right upper lobe, early infection is not excluded 07/15/2020 chest CT high  resolution: Mild groundglass opacities indeterminate for early NSIP or UIP. 08/03/2022 CT chest high resolution: Appearance similar to prior.  This likely UIP.  Stable small pulmonary nodules.  Possible NSIP.  Coronary calcification noted on CT. 09/11/2022 PFTs: FEV1 1.90 L or 111% of predicted, FVC 2.33 L or 101% predicted, FEV1/FVC 81%.  Lung volumes normal.  Diffusion capacity corrects partly by alveolar volume to 87%, overall improvement from prior.   Review of Systems A 10 point review of systems was performed and it is as noted above otherwise negative.  Patient Active Problem List   Diagnosis Date Noted   Age-related osteoporosis without current pathological fracture 08/01/2022   Moderate persistent asthma without complication 75/64/3329   Bronchitis 04/04/2020   Asthma due to seasonal allergies 04/04/2020   Class 1 obesity due to excess calories with serious comorbidity and body mass index (BMI) of 33.0 to 33.9 in adult 02/10/2020   Gastroesophageal reflux disease without esophagitis 07/04/2017   Obesity (BMI 30.0-34.9) 07/04/2017   Pure hypercholesterolemia 07/04/2017   Osteopenia 07/04/2017   Social History   Tobacco Use   Smoking status: Former    Packs/day: 1.00    Years: 10.00    Total pack years: 10.00    Types: Cigarettes    Quit date: 09/03/1998    Years since quitting: 24.0   Smokeless tobacco: Never  Substance Use Topics   Alcohol use: No    Comment: rarely   Allergies  Allergen Reactions   Tape Itching    Redness  Current Meds  Medication Sig   albuterol (PROVENTIL) (2.5 MG/3ML) 0.083% nebulizer solution Take 3 mLs (2.5 mg total) by nebulization every 6 (six) hours as needed for wheezing or shortness of breath.   albuterol (VENTOLIN HFA) 108 (90 Base) MCG/ACT inhaler Inhale 2 puffs into the lungs every 4 (four) hours as needed for wheezing or shortness of breath.   Calcium Carbonate-Vitamin D 600-400 MG-UNIT tablet Take 1 tablet by mouth 2 (two) times  daily.    esomeprazole (NEXIUM) 40 MG capsule Take 40 mg by mouth daily before supper.    famotidine (PEPCID) 20 MG tablet TAKE 1 TABLET(20 MG) BY MOUTH EVERY NIGHT   Fluticasone-Umeclidin-Vilant (TRELEGY ELLIPTA) 100-62.5-25 MCG/ACT AEPB INHALE 1 PUFF INTO THE LUNGS DAILY   Melatonin 5 MG TABS Take 5 mg by mouth as needed.   montelukast (SINGULAIR) 10 MG tablet TAKE 1 TABLET(10 MG) BY MOUTH AT BEDTIME   Multiple Vitamins-Minerals (CENTRUM SILVER 50+WOMEN PO) Take 1 tablet by mouth daily.    Probiotic Product (PROBIOTIC PO) Take 1 tablet by mouth daily.   simvastatin (ZOCOR) 20 MG tablet Take 20 mg by mouth daily at 6 PM.    Zinc Acetate 25 MG CAPS Take 25 mg by mouth daily.   Immunization History  Administered Date(s) Administered   Fluad Quad(high Dose 65+) 06/18/2019, 07/09/2019, 08/03/2022   Influenza, High Dose Seasonal PF 06/21/2018   Influenza-Unspecified 07/05/2015, 06/03/2016, 06/25/2017, 06/13/2020, 07/10/2021   PFIZER(Purple Top)SARS-COV-2 Vaccination 12/22/2019, 01/12/2020, 07/18/2020   Pneumococcal Conjugate-13 01/11/2016   Pneumococcal Polysaccharide-23 06/25/2017   Respiratory Syncytial Virus Vaccine,Recomb Aduvanted(Arexvy) 07/04/2022   Tdap 03/23/2016       Objective:   Physical Exam BP 118/78 (BP Location: Left Arm, Cuff Size: Normal)   Pulse 63   Temp 98.3 F (36.8 C)   Ht 4\' 9"  (1.448 m)   Wt 156 lb 9.6 oz (71 kg)   SpO2 96%   BMI 33.89 kg/m   SpO2: 96 % O2 Device: None (Room air)  GENERAL: Well-developed well-nourished woman, no acute distress, well-groomed.  Fully ambulatory.  No conversational dyspnea. HEAD: Normocephalic, atraumatic.  EYES: Pupils equal, round, reactive to light.  No scleral icterus.  MOUTH: Dentition intact, oral mucosa moist.  Tongue not coated. NECK: Supple. No thyromegaly. Trachea midline. No JVD.  No adenopathy. PULMONARY: Good air entry bilaterally.  Coarse with some rare faint end expiratory wheeze. CARDIOVASCULAR: S1 and  S2. Regular rate and rhythm.  No rubs, murmurs or gallops heard. GASTROINTESTINAL: Benign. MUSCULOSKELETAL: No joint deformity, no clubbing, no edema.  NEUROLOGIC: No focal deficit, no gait disturbance, speech is fluent. SKIN: Intact,warm,dry. PSYCH: Mood and behavior normal    Representative image from HRCT performed 03 August 2022 showing areas of groundglass attenuation septal thickening and mild subpleural reticulation possible NSIP no significant change from prior:      Assessment & Plan:     ICD-10-CM   1. Asthmatic bronchitis, moderate persistent, uncomplicated  Y19.50 Nitric oxide    CANCELED: Ambulatory referral to Cardiology   Continue Trelegy Ellipta Continue as needed albuterol    2. 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 Coronary artery calcification  I25.10 Ambulatory referral to Cardiology   I25.84    Patient would like further evaluation Referral to cardiology     Orders Placed This Encounter  Procedures   Ambulatory referral to Cardiology    Referral Priority:  Routine    Referral Type:   Consultation    Referral Reason:   Specialty Services Required    Requested Specialty:   Cardiology    Number of Visits Requested:   1   Nitric oxide   Will see the patient in follow-up in 4 to 6 weeks time she is to contact us prior to that time should any new difficulties arise.  Gailen Shelter, MD Advanced Bronchoscopy PCCM [facility]    *This note was dictated using voice recognition software/Dragon.  Despite best efforts to proofread, errors can occur which can change the meaning. Any transcriptional errors that result from this process are unintentional and may not be fully corrected at the time of dictation.

## 2022-09-18 NOTE — Patient Instructions (Signed)
We have sent the referral to cardiology for evaluation of your calcification in the heart arteries.  Your lung function actually improved from your lung function in 2021.  Please continue to use your inhaler.  Try Zyrtec at bedtime to see if this helps with your issues with cough.  You can get this over-the-counter.  It is okay to use Robitussin or elderberry syrup (Sambucol) as needed for cough.  Will see you in follow-up in 4 to 6 weeks time call sooner should any new problems arise.

## 2022-09-19 ENCOUNTER — Ambulatory Visit
Admission: RE | Admit: 2022-09-19 | Discharge: 2022-09-19 | Disposition: A | Discharge status: Home / Self Care | Payer: Medicare HMO | Source: Ambulatory Visit | Attending: Family Medicine | Admitting: Family Medicine

## 2022-09-19 DIAGNOSIS — Z1231 Encounter for screening mammogram for malignant neoplasm of breast: Secondary | ICD-10-CM | POA: Diagnosis present

## 2022-09-20 ENCOUNTER — Encounter: Payer: Self-pay | Admitting: Pulmonary Disease

## 2022-09-24 ENCOUNTER — Other Ambulatory Visit: Payer: Self-pay | Admitting: Family Medicine

## 2022-09-24 DIAGNOSIS — R928 Other abnormal and inconclusive findings on diagnostic imaging of breast: Secondary | ICD-10-CM

## 2022-09-26 ENCOUNTER — Ambulatory Visit
Admission: RE | Admit: 2022-09-26 | Discharge: 2022-09-26 | Disposition: A | Discharge status: Home / Self Care | Payer: Medicare HMO | Source: Ambulatory Visit | Attending: Family Medicine | Admitting: Family Medicine

## 2022-09-26 ENCOUNTER — Ambulatory Visit: Payer: Medicare HMO

## 2022-09-26 DIAGNOSIS — R928 Other abnormal and inconclusive findings on diagnostic imaging of breast: Secondary | ICD-10-CM | POA: Insufficient documentation

## 2022-10-24 ENCOUNTER — Other Ambulatory Visit
Admission: RE | Admit: 2022-10-24 | Discharge: 2022-10-24 | Disposition: A | Discharge status: Home / Self Care | Payer: Medicare HMO | Attending: Cardiology | Admitting: Cardiology

## 2022-10-24 ENCOUNTER — Encounter: Payer: Self-pay | Admitting: Cardiology

## 2022-10-24 ENCOUNTER — Ambulatory Visit: Payer: Medicare HMO | Attending: Cardiology | Admitting: Cardiology

## 2022-10-24 VITALS — BP 128/74 | HR 81 | Ht 59.0 in | Wt 156.8 lb

## 2022-10-24 DIAGNOSIS — I2510 Atherosclerotic heart disease of native coronary artery without angina pectoris: Secondary | ICD-10-CM

## 2022-10-24 DIAGNOSIS — R0609 Other forms of dyspnea: Secondary | ICD-10-CM

## 2022-10-24 DIAGNOSIS — E7800 Pure hypercholesterolemia, unspecified: Secondary | ICD-10-CM | POA: Diagnosis not present

## 2022-10-24 LAB — BASIC METABOLIC PANEL
Anion gap: 7 (ref 5–15)
BUN: 19 mg/dL (ref 8–23)
CO2: 24 mmol/L (ref 22–32)
Calcium: 9 mg/dL (ref 8.9–10.3)
Chloride: 104 mmol/L (ref 98–111)
Creatinine, Ser: 0.91 mg/dL (ref 0.44–1.00)
GFR, Estimated: >60 mL/min (ref >60)
Glucose, Bld: 107 mg/dL — ABNORMAL HIGH (ref 70–99)
Potassium: 4 mmol/L (ref 3.5–5.1)
Sodium: 135 mmol/L (ref 135–145)

## 2022-10-24 MED ORDER — IVABRADINE HCL 5 MG PO TABS: 10.0000 mg | ORAL_TABLET | Freq: Once | ORAL | 0 refills | Status: AC

## 2022-10-24 MED ORDER — SIMVASTATIN 40 MG PO TABS: 40.0000 mg | ORAL_TABLET | Freq: Every day | ORAL | 3 refills | Status: DC

## 2022-10-24 MED ORDER — METOPROLOL TARTRATE 100 MG PO TABS: 100.0000 mg | ORAL_TABLET | Freq: Once | ORAL | 0 refills | Status: DC

## 2022-10-24 NOTE — Patient Instructions (Signed)
Medication Instructions:   INCREASE Simvastatin - take one tablet (43m) by mouth daily.   *If you need a refill on your cardiac medications before your next appointment, please call your pharmacy*   Lab Work:  Your physician recommends you go to the medical mall for labs.   If you have labs (blood work) drawn today and your tests are completely normal, you will receive your results only by: M[place](if you have MyChart) OR A paper copy in the mail If you have any lab test that is abnormal or we need to change your treatment, we will call you to review the results.   Testing/Procedures:  Your physician has requested that you have an echocardiogram. Echocardiography is a painless test that uses sound waves to create images of your heart. It provides your doctor with information about the size and shape of your heart and how well your heart's chambers and valves are working. This procedure takes approximately one hour. There are no restrictions for this procedure. Please do NOT wear cologne, perfume, aftershave, or lotions (deodorant is allowed). Please arrive 15 minutes prior to your appointment time.    Your cardiac CT is scheduled for  March 7th, 2024 @ 12:30pm  [facility] [phone] [facility] please arrive 15 mins early for check-in and test prep.   Please follow these instructions carefully (unless otherwise directed):  Hold all erectile dysfunction medications at least 3 days (72 hrs) prior to test. (Ie viagra, cialis, sildenafil, tadalafil, etc) We will administer nitroglycerin during this exam.   On the Night Before the Test: Be sure to Drink plenty of water. Do not consume any caffeinated/decaffeinated beverages or chocolate 12 hours prior to your test. Do not take any antihistamines 12  hours prior to your test.  On the Day of the Test: Drink plenty of water until 1 hour prior to the test. Do not eat any food 1 hour prior to test. You may take your regular medications prior to the test.  Take metoprolol (Lopressor) two hours prior to test. Take Corlanor two hours prior to test FEMALES- please wear underwire-free bra if available, avoid dresses & tight clothing      After the Test: Drink plenty of water. After receiving IV contrast, you may experience a mild flushed feeling. This is normal. On occasion, you may experience a mild rash up to 24 hours after the test. This is not dangerous. If this occurs, you can take Benadryl 25 mg and increase your fluid intake. If you experience trouble breathing, this can be serious. If it is severe call 911 IMMEDIATELY. If it is mild, please call our office. If you take any of these medications: Glipizide/Metformin, Avandament, Glucavance, please do not take 48 hours after completing test unless otherwise instructed.   Follow-Up: [facility] our [facility].  As part of our continuing mission to provide you with exceptional heart care, we have created designated Provider Care Teams.  These Care Teams include your primary Cardiologist (physician) and Advanced Practice Providers (APPs -  Physician Assistants and Nurse Practitioners) who all work together to provide you with the care you need, when you need it.  We recommend signing up for the patient portal called "MyChart".  Sign up information is provided on this After Visit Summary.  MyChart is used to connect  with patients for Virtual Visits (Telemedicine).  Patients are able to view lab/test results, encounter notes, upcoming appointments, etc.  Non-urgent messages can be sent to your provider as well.   To learn more about what you can do with MyChart, go to [email or web address].    Your next appointment:    After Testing  Provider:   You  may see Kate Sable, MD or one of the following Advanced Practice Providers on your designated Care Team:   Murray Hodgkins, NP Christell Faith, PA-C Cadence Kathlen Mody, PA-C Gerrie Nordmann, NP

## 2022-10-24 NOTE — Progress Notes (Addendum)
Cardiology Office Note:    Date:  10/24/2022   ID:  Jennifer Farmer, DOB 10/25/1945, MRN [id number]  PCP:  Dion Body, [name] [facility] Cardiologist:  Kate Sable, MD     Referring MD: Tyler Pita, MD   Chief Complaint  Patient presents with   New Patient (Initial Visit)    Coronary Artery Calcification, No Hx, SOB due to asthma, Family Hx     History of Present Illness:    Jennifer Farmer is a 77 y.o. female with a hx of asthma, hyperlipidemia, former smoker x 10 years who presents due to coronary artery calcification.  Patient states having shortness of breath with exertion ongoing for some time.  Attributes symptoms to having asthma.  Denies chest pain.  Past family history of heart disease, unsure which.  Brother died suddenly age 51.  -Patient had chest CT 08/2022 due to cough which showed LAD calcification and aortic arch atherosclerosis.  Past Medical History:  Diagnosis Date   Edema    FEET/LEGS   Enlarged thyroid    GERD (gastroesophageal reflux disease)    Hyperlipidemia    Hypothyroidism    Motion sickness    boats   Obesity    Obesity    Osteopenia    Palpitations    Pure hypercholesterolemia with target low density lipoprotein (LDL) cholesterol less than 130 mg/dL     Past Surgical History:  Procedure Laterality Date   ABDOMINAL HYSTERECTOMY     CATARACT EXTRACTION W/PHACO Right 09/19/2017   Procedure: CATARACT EXTRACTION PHACO AND INTRAOCULAR LENS PLACEMENT ([place]);  Surgeon: Eulogio Bear, MD;  Location: [facility] ORS;  Service: Ophthalmology;  Laterality: Right;  [place] 00:17.1 AP% 8.7 CDE 1.49 Fluid Pack Lot # [id number]   CATARACT EXTRACTION W/PHACO Left 10/10/2017   Procedure: CATARACT EXTRACTION PHACO AND INTRAOCULAR LENS PLACEMENT (I[facility]);  Surgeon: Eulogio Bear, MD;  Location: [facility] ORS;  Service: Ophthalmology;  Laterality: Left;  fluid pack lot # [id number]  exp10/31/2020 [place]   00:20.5 AP%   10.7 CDE    2.9    CESAREAN SECTION     COLONOSCOPY WITH PROPOFOL N/A 11/28/2016   Procedure: COLONOSCOPY WITH PROPOFOL;  Surgeon: Manya Silvas, MD;  Location: [facility] END[facility]COPY;  Service: Endoscopy;  Laterality: N/A;   ESOPHAGOGASTRODUODENOSCOPY (EGD) WITH PROPOFOL N/A 03/04/2019   Procedure: ESOPHAGOGASTRODUODENOSCOPY (EGD) WITH PROPOFOL;  Surgeon: Toledo, Benay Pike, MD;  Location: [facility] ENDOSCOPY;  Service: Gastroenterology;  Laterality: N/A;   EYE SURGERY     KNEE ARTHROSCOPY Left 06/26/2018   Procedure: ARTHROSCOPY KNEE WITH PARTIAL MEDIAL MENISECTOMY;  Surgeon: Leim Fabry, MD;  Location: [place];  Service: Orthopedics;  Laterality: Left;    Current Medications: Current Meds  Medication Sig   albuterol (PROVENTIL) (2.5 MG/3ML) 0.083% nebulizer solution Take 3 mLs (2.5 mg total) by nebulization every 6 (six) hours as needed for wheezing or shortness of breath.   albuterol (VENTOLIN HFA) 108 (90 Base) MCG/ACT inhaler Inhale 2 puffs into the lungs every 4 (four) hours as needed for wheezing or shortness of breath.   Ascorbic Acid (VITAMIN C) 100 MG tablet Take 100 mg by mouth daily.   aspirin EC 81 MG tablet Take 81 mg by mouth daily. Swallow whole.   Cholecalciferol (VITAMIN D-3) 25 MCG (1000 UT) CAPS Take by mouth.   dexlansoprazole (DEXILANT) 60 MG capsule Take 60 mg by mouth daily.   famotidine (PEPCID) 20 MG tablet TAKE 1 TABLET(20 MG) BY MOUTH EVERY Farmer  Fluticasone-Umeclidin-Vilant (TRELEGY ELLIPTA) 100-62.5-25 MCG/ACT AEPB INHALE 1 PUFF INTO THE LUNGS DAILY   ivabradine (CORLANOR) 5 MG TABS tablet Take 2 tablets (10 mg total) by mouth once for 1 dose. TWO HOURS PRIOR TO CARDIAC CTA   metoprolol tartrate (LOPRESSOR) 100 MG tablet Take 1 tablet (100 mg total) by mouth once for 1 dose. TWO HOURS PRIOR TO CARDIAC CTA   montelukast (SINGULAIR) 10 MG tablet TAKE 1 TABLET(10 MG) BY MOUTH AT BEDTIME   Multiple Vitamins-Minerals (CENTRUM SILVER 50+WOMEN PO) Take 1 tablet by mouth daily.     Omega-3 Fatty Acids (FISH OIL PO) Take by mouth.   polycarbophil (FIBERCON) 625 MG tablet Take 750 mg by mouth daily.   Probiotic Product (PROBIOTIC PO) Take 1 tablet by mouth daily.   simvastatin (ZOCOR) 40 MG tablet Take 1 tablet (40 mg total) by mouth at bedtime.   Turmeric (QC TUMERIC COMPLEX PO) Take by mouth.   vitamin B-12 (CYANOCOBALAMIN) 100 MCG tablet Take 100 mcg by mouth daily.   Zinc Acetate 25 MG CAPS Take 25 mg by mouth daily.   [DISCONTINUED] simvastatin (ZOCOR) 20 MG tablet Take 20 mg by mouth daily at 6 PM.      Allergies:   Tape   Social History   Socioeconomic History   Marital status: Married    Spouse name: Not on file   Number of children: Not on file   Years of education: Not on file   Highest education level: Not on file  Occupational History   Not on file  Tobacco Use   Smoking status: Former    Packs/day: 1.00    Years: 10.00    Total pack years: 10.00    Types: Cigarettes    Quit date: 09/03/1998    Years since quitting: 24.1   Smokeless tobacco: Never  Vaping Use   Vaping Use: Never used  Substance and Sexual Activity   Alcohol use: No    Comment: rarely   Drug use: No   Sexual activity: Not on file  Other Topics Concern   Not on file  Social History Narrative   Not on file   Social Determinants of Health   Financial Resource Strain: Not on file  Food Insecurity: Not on file  Transportation Needs: Not on file  Physical Activity: Not on file  Stress: Not on file  Social Connections: Not on file     Family History: The patient's family history includes Breast cancer (age of onset: 33) in an other family member; Heart Problems in her brother; Hypertension in her brother and father; Stroke in her father.  ROS:   Please see the history of present illness.     All other systems reviewed and are negative.  EKGs/Labs/Other Studies Reviewed:    The following studies were reviewed today:   EKG:  EKG is  ordered today.  The ekg ordered  today demonstrates normal sinus rhythm, normal ECG  Recent Labs: No results found for requested labs within last 365 days.  Recent Lipid Panel No results found for: "CHOL", "TRIG", "HDL", "CHOLHDL", "VLDL", "LDLCALC", "LDLDIRECT"  Outside lipid panel 06/07/2022 total cholesterol 185, HDL 66.9, LDL 92, triglyceride 129.  Risk Assessment/Calculations:             Physical Exam:    VS:  BP 128/74 (BP Location: Right Arm)   Pulse 81   Ht 4' 11"$  (1.499 m)   Wt 156 lb 12.8 oz (71.1 kg)   SpO2 98%   BMI  31.67 kg/m     Wt Readings from Last 3 Encounters:  10/24/22 156 lb 12.8 oz (71.1 kg)  09/18/22 156 lb 9.6 oz (71 kg)  07/18/22 151 lb 9.6 oz (68.8 kg)     GEN:  Well nourished, well developed in no acute distress HEENT: Normal NECK: No JVD; No carotid bruits CARDIAC: RRR, no murmurs, rubs, gallops RESPIRATORY:  Clear to auscultation without rales, wheezing or rhonchi  ABDOMEN: Soft, non-tender, non-distended MUSCULOSKELETAL:  No edema; No deformity  SKIN: Warm and dry NEUROLOGIC:  Alert and oriented x 3 PSYCHIATRIC:  Normal affect   ASSESSMENT:    1. Dyspnea on exertion   2. Coronary artery disease involving native coronary artery of native heart, unspecified whether angina present   3. Pure hypercholesterolemia    PLAN:    In order of problems listed above:  Dyspnea on exertion, this could be an anginal equivalent.  Get echo, get coronary CTA. CAD/LAD calcifications on chest CT.  Aspirin 81 mg, increase simvastatin to 40 mg.  Last LDL 92, not at goal. Hyperlipidemia, increase simvastatin to 40 mg daily.  Follow-up after echo and coronary CTA.     Medication Adjustments/Labs and Tests Ordered: Current medicines are reviewed at length with the patient today.  Concerns regarding medicines are outlined above.  Orders Placed This Encounter  Procedures   CT CORONARY MORPH W/CTA COR W/SCORE W/CA W/CM &/OR WO/CM   Basic Metabolic Panel (BMET)   EKG 12-Lead    ECHOCARDIOGRAM COMPLETE   Meds ordered this encounter  Medications   metoprolol tartrate (LOPRESSOR) 100 MG tablet    Sig: Take 1 tablet (100 mg total) by mouth once for 1 dose. TWO HOURS PRIOR TO CARDIAC CTA    Dispense:  1 tablet    Refill:  0   ivabradine (CORLANOR) 5 MG TABS tablet    Sig: Take 2 tablets (10 mg total) by mouth once for 1 dose. TWO HOURS PRIOR TO CARDIAC CTA    Dispense:  2 tablet    Refill:  0   simvastatin (ZOCOR) 40 MG tablet    Sig: Take 1 tablet (40 mg total) by mouth at bedtime.    Dispense:  90 tablet    Refill:  3    Patient Instructions  Medication Instructions:   INCREASE Simvastatin - take one tablet (59m) by mouth daily.   *If you need a refill on your cardiac medications before your next appointment, please call your pharmacy*   Lab Work:  Your physician recommends you go to the medical mall for labs.   If you have labs (blood work) drawn today and your tests are completely normal, you will receive your results only by: MMalmo(if you have MyChart) OR A paper copy in the mail If you have any lab test that is abnormal or we need to change your treatment, we will call you to review the results.   Testing/Procedures:  Your physician has requested that you have an echocardiogram. Echocardiography is a painless test that uses sound waves to create images of your heart. It provides your doctor with information about the size and shape of your heart and how well your heart's chambers and valves are working. This procedure takes approximately one hour. There are no restrictions for this procedure. Please do NOT wear cologne, perfume, aftershave, or lotions (deodorant is allowed). Please arrive 15 minutes prior to your appointment time.    Your cardiac CT is scheduled for  March 7th, 2024 @  12:30pm  [facility] [address] [place], [place] [address] [phone]  If scheduled at  [facility] or [facility], please arrive 15 mins early for check-in and test prep.   Please follow these instructions carefully (unless otherwise directed):  Hold all erectile dysfunction medications at least 3 days (72 hrs) prior to test. (Ie viagra, cialis, sildenafil, tadalafil, etc) We will administer nitroglycerin during this exam.   On the Farmer Before the Test: Be sure to Drink plenty of water. Do not consume any caffeinated/decaffeinated beverages or chocolate 12 hours prior to your test. Do not take any antihistamines 12 hours prior to your test.  On the Day of the Test: Drink plenty of water until 1 hour prior to the test. Do not eat any food 1 hour prior to test. You may take your regular medications prior to the test.  Take metoprolol (Lopressor) two hours prior to test. Take Corlanor two hours prior to test FEMALES- please wear underwire-free bra if available, avoid dresses & tight clothing      After the Test: Drink plenty of water. After receiving IV contrast, you may experience a mild flushed feeling. This is normal. On occasion, you may experience a mild rash up to 24 hours after the test. This is not dangerous. If this occurs, you can take Benadryl 25 mg and increase your fluid intake. If you experience trouble breathing, this can be serious. If it is severe call 911 IMMEDIATELY. If it is mild, please call our office. If you take any of these medications: Glipizide/Metformin, Avandament, Glucavance, please do not take 48 hours after completing test unless otherwise instructed.   Follow-Up: At [facility], you and your health needs are our priority.  As part of our continuing mission to provide you with exceptional heart care, we have created designated Provider Care Teams.  These Care Teams include your primary Cardiologist (physician) and Advanced Practice Providers (APPs -  Physician Assistants and Nurse  Practitioners) who all work together to provide you with the care you need, when you need it.  We recommend signing up for the patient portal called "MyChart".  Sign up information is provided on this After Visit Summary.  MyChart is used to connect with patients for Virtual Visits (Telemedicine).  Patients are able to view lab/test results, encounter notes, upcoming appointments, etc.  Non-urgent messages can be sent to your provider as well.   To learn more about what you can do with MyChart, go to [email or web address].    Your next appointment:    After Testing  Provider:   You may see Kate Sable, MD or one of the following Advanced Practice Providers on your designated Care Team:   Murray Hodgkins, NP Christell Faith, PA-C Cadence Kathlen Mody, PA-C Gerrie Nordmann, NP   Signed, Kate Sable, MD  10/24/2022 9:37 AM    [place]

## 2022-11-06 ENCOUNTER — Telehealth (HOSPITAL_COMMUNITY): Payer: Self-pay | Admitting: Emergency Medicine

## 2022-11-06 NOTE — Telephone Encounter (Signed)
Reaching out to patient to offer assistance regarding upcoming cardiac imaging study; pt verbalizes understanding of appt date/time, parking situation and where to check in, pre-test NPO status and medications ordered, and verified current allergies; name and call back number provided for further questions should they arise Jennifer Bond RN Navigator Cardiac Imaging [facility] [phone] office [phone] cell  Arrival 1215 [facility] '100mg'$  metoprolol + '10mg'$  ivabraidne Denies iv isuses Aware contrast/nitro

## 2022-11-08 ENCOUNTER — Ambulatory Visit: Admission: RE | Admit: 2022-11-08 | Payer: Medicare HMO | Source: Ambulatory Visit

## 2022-11-08 ENCOUNTER — Other Ambulatory Visit: Payer: Self-pay | Admitting: Pulmonary Disease

## 2022-11-14 ENCOUNTER — Telehealth (HOSPITAL_COMMUNITY): Payer: Self-pay | Admitting: *Deleted

## 2022-11-14 NOTE — Telephone Encounter (Signed)
Reaching out to patient to offer assistance regarding upcoming cardiac imaging study; pt verbalizes understanding of appt date/time, parking situation and where to check in, pre-test NPO status and medications ordered, and verified current allergies; name and call back number provided for further questions should they arise  Mckenlee Mangham RN Navigator Cardiac Imaging [facility] [phone] office [phone] cell  Patient to take 100mg metoprolol tartrate and 10mg ivabradine two hours prior to her cardiac CT scan. 

## 2022-11-15 ENCOUNTER — Ambulatory Visit
Admission: RE | Admit: 2022-11-15 | Discharge: 2022-11-15 | Disposition: A | Discharge status: Home / Self Care | Payer: Medicare HMO | Source: Ambulatory Visit | Attending: Cardiology | Admitting: Cardiology

## 2022-11-15 DIAGNOSIS — R0609 Other forms of dyspnea: Secondary | ICD-10-CM | POA: Diagnosis present

## 2022-11-15 DIAGNOSIS — I2510 Atherosclerotic heart disease of native coronary artery without angina pectoris: Secondary | ICD-10-CM | POA: Insufficient documentation

## 2022-11-15 MED ORDER — NITROGLYCERIN 0.4 MG SL SUBL
0.8000 mg | SUBLINGUAL_TABLET | Freq: Once | SUBLINGUAL | Status: AC
  Administered 2022-11-15: 0.8 mg via SUBLINGUAL

## 2022-11-15 MED ORDER — DILTIAZEM HCL 25 MG/5ML IV SOLN
10.0000 mg | Freq: Once | INTRAVENOUS | Status: AC
  Administered 2022-11-15: 10 mg via INTRAVENOUS

## 2022-11-15 MED ORDER — METOPROLOL TARTRATE 5 MG/5ML IV SOLN
10.0000 mg | Freq: Once | INTRAVENOUS | Status: AC
  Administered 2022-11-15: 10 mg via INTRAVENOUS

## 2022-11-15 MED ORDER — IOHEXOL 350 MG/ML SOLN
100.0000 mL | Freq: Once | INTRAVENOUS | Status: AC | PRN
  Administered 2022-11-15: 100 mL via INTRAVENOUS

## 2022-11-15 NOTE — Progress Notes (Signed)
Pt tolerated procedure well. ABC intact. Pt provided water and encouraged to drink plenty of fluids today. Pt denies any needs. Pt ambulatory with steady gait.

## 2022-12-07 ENCOUNTER — Ambulatory Visit: Payer: Medicare HMO | Attending: Cardiology

## 2022-12-07 DIAGNOSIS — I2510 Atherosclerotic heart disease of native coronary artery without angina pectoris: Secondary | ICD-10-CM | POA: Diagnosis not present

## 2022-12-07 DIAGNOSIS — R0609 Other forms of dyspnea: Secondary | ICD-10-CM | POA: Diagnosis not present

## 2022-12-07 LAB — ECHOCARDIOGRAM COMPLETE
AR max vel: 2.27 cm2
AV Area VTI: 2.18 cm2
AV Area mean vel: 2.19 cm2
AV Mean grad: 4 mmHg
AV Peak grad: 7.3 mmHg
Ao pk vel: 1.35 m/s
Area-P 1/2: 3.75 cm2
Calc EF: 54.7 %
S' Lateral: 2.7 cm
Single Plane A2C EF: 59.2 %
Single Plane A4C EF: 52.7 %

## 2022-12-17 ENCOUNTER — Encounter: Payer: Self-pay | Admitting: Cardiology

## 2022-12-17 ENCOUNTER — Ambulatory Visit: Payer: Medicare HMO | Attending: Cardiology | Admitting: Cardiology

## 2022-12-17 VITALS — BP 132/76 | HR 80 | Ht 60.0 in | Wt 155.2 lb

## 2022-12-17 DIAGNOSIS — R0609 Other forms of dyspnea: Secondary | ICD-10-CM | POA: Diagnosis not present

## 2022-12-17 DIAGNOSIS — E7800 Pure hypercholesterolemia, unspecified: Secondary | ICD-10-CM | POA: Diagnosis not present

## 2022-12-17 DIAGNOSIS — I2510 Atherosclerotic heart disease of native coronary artery without angina pectoris: Secondary | ICD-10-CM | POA: Diagnosis not present

## 2022-12-17 NOTE — Progress Notes (Signed)
Cardiology Office Note:    Date:  12/17/2022   ID:  Jennifer Farmer, DOB 07-03-1946, MRN [id number]  PCP:  Marisue Ivan, MD   [facility] Providers Cardiologist:  Debbe Odea, MD     Referring MD: Marisue Ivan, MD   Chief Complaint  Patient presents with   Follow-up    Discuss test results.  Patient denies new or acute cardiac problems/concerns today.      History of Present Illness:    Jennifer Farmer is a 77 y.o. female with a hx of coronary calcification, asthma, hyperlipidemia, former smoker x 10 years who presents for follow-up.  Previously seen due to coronary calcifications and shortness of breath.  Coronary CTA was ordered to evaluate obstructive CAD.  Echocardiogram also obtained.  She presents for testing results.  States feeling well, no concerns at this time.  Tolerating increased dose of simvastatin 40 mg daily.  Prior notes/studies Echo 12/2022 EF 55 to 60% Coronary CTA 11/2022 coronary calcium 21.8, minimal proximal LAD stenosis less than 25%. Patient had chest CT 08/2022 due to cough which showed LAD calcification and aortic arch atherosclerosis.  Past Medical History:  Diagnosis Date   Edema    FEET/LEGS   Enlarged thyroid    GERD (gastroesophageal reflux disease)    Hyperlipidemia    Hypothyroidism    Motion sickness    boats   Obesity    Obesity    Osteopenia    Palpitations    Pure hypercholesterolemia with target low density lipoprotein (LDL) cholesterol less than 130 mg/dL     Past Surgical History:  Procedure Laterality Date   ABDOMINAL HYSTERECTOMY     CATARACT EXTRACTION W/PHACO Right 09/19/2017   Procedure: CATARACT EXTRACTION PHACO AND INTRAOCULAR LENS PLACEMENT (IOC);  Surgeon: Nevada Crane, MD;  Location: [facility] ORS;  Service: Ophthalmology;  Laterality: Right;  [place] 00:17.1 AP% 8.7 CDE 1.49 Fluid Pack Lot # [id number]   CATARACT EXTRACTION W/PHACO Left 10/10/2017   Procedure: CATARACT EXTRACTION PHACO AND  INTRAOCULAR LENS PLACEMENT (IOC);  Surgeon: Nevada Crane, MD;  Location: [facility];  Service: Ophthalmology;  Laterality: Left;  fluid pack lot # [id number]  exp10/31/2020 [place]   00:20.5 AP%   10.7 CDE    2.9   CESAREAN SECTION     COLONOSCOPY WITH PROPOFOL N/A 11/28/2016   Procedure: COLONOSCOPY WITH PROPOFOL;  Surgeon: Scot Jun, MD;  Location: [facility];  Service: Endoscopy;  Laterality: N/A;   ESOPHAGOGASTRODUODENOSCOPY (EGD) WITH PROPOFOL N/A 03/04/2019   Procedure: ESOPHAGOGASTRODUODENOSCOPY (EGD) WITH PROPOFOL;  Surgeon: Tol[place], Boykin Nearing, MD;  Location: [facility] ENDOSCOPY;  Service: Gastroenterology;  Laterality: N/A;   EYE SURGERY     KNEE ARTHROSCOPY Left 06/26/2018   Procedure: ARTHROSCOPY KNEE WITH PARTIAL MEDIAL MENISECTOMY;  Surgeon: Signa Kell, MD;  Location: [facility];  Service: Orthopedics;  Laterality: Left;    Current Medications: Current Meds  Medication Sig   albuterol (PROVENTIL) (2.5 MG/3ML) 0.083% nebulizer solution Take 3 mLs (2.5 mg total) by nebulization every 6 (six) hours as needed for wheezing or shortness of breath.   albuterol (VENTOLIN HFA) 108 (90 Base) MCG/ACT inhaler Inhale 2 puffs into the lungs every 4 (four) hours as needed for wheezing or shortness of breath.   Ascorbic Acid (VITAMIN C) 100 MG tablet Take 100 mg by mouth daily.   aspirin EC 81 MG tablet Take 81 mg by mouth daily. Swallow whole.   Cholecalciferol (VITAMIN D-3) 25 MCG (1000 UT) CAPS Take by mouth.  dexlansoprazole (DEXILANT) 60 MG capsule Take 60 mg by mouth daily.   famotidine (PEPCID) 20 MG tablet TAKE 1 TABLET(20 MG) BY MOUTH EVERY NIGHT   Fluticasone-Umeclidin-Vilant (TRELEGY ELLIPTA) 100-62.5-25 MCG/ACT AEPB INHALE 1 PUFF INTO THE LUNGS DAILY   montelukast (SINGULAIR) 10 MG tablet TAKE 1 TABLET(10 MG) BY MOUTH AT BEDTIME   Multiple Vitamins-Minerals (CENTRUM SILVER 50+WOMEN PO) Take 1 tablet by mouth daily.    Omega-3 Fatty Acids (FISH OIL PO) Take by mouth.    polycarbophil (FIBERCON) 625 MG tablet Take 750 mg by mouth daily.   Probiotic Product (PROBIOTIC PO) Take 1 tablet by mouth daily.   simvastatin (ZOCOR) 40 MG tablet Take 1 tablet (40 mg total) by mouth at bedtime.   Turmeric (QC TUMERIC COMPLEX PO) Take by mouth.   vitamin B-12 (CYANOCOBALAMIN) 100 MCG tablet Take 100 mcg by mouth daily.   Zinc Acetate 25 MG CAPS Take 25 mg by mouth daily.     Allergies:   Tape   Social History   Socioeconomic History   Marital status: Married    Spouse name: Not on file   Number of children: Not on file   Years of education: Not on file   Highest education level: Not on file  Occupational History   Not on file  Tobacco Use   Smoking status: Former    Packs/day: 1.00    Years: 10.00    Additional pack years: 0.00    Total pack years: 10.00    Types: Cigarettes    Quit date: 09/03/1998    Years since quitting: 24.3   Smokeless tobacco: Never  Vaping Use   Vaping Use: Never used  Substance and Sexual Activity   Alcohol use: No    Comment: rarely   Drug use: No   Sexual activity: Not on file  Other Topics Concern   Not on file  Social History Narrative   Not on file   Social Determinants of Health   Financial Resource Strain: Not on file  Food Insecurity: Not on file  Transportation Needs: Not on file  Physical Activity: Not on file  Stress: Not on file  Social Connections: Not on file     Family History: The patient's family history includes Breast cancer (age of onset: 67) in an other family member; Heart Problems in her brother; Hypertension in her brother and father; Stroke in her father.  ROS:   Please see the history of present illness.     All other systems reviewed and are negative.  EKGs/Labs/Other Studies Reviewed:    The following studies were reviewed today:   EKG:  EKG not ordered today.   Recent Labs: 10/24/2022: BUN 19; Creatinine, Ser 0.91; Potassium 4.0; Sodium 135  Recent Lipid Panel No results found  for: "CHOL", "TRIG", "HDL", "CHOLHDL", "VLDL", "LDLCALC", "LDLDIRECT"  Outside lipid panel 06/07/2022 total cholesterol 185, HDL 66.9, LDL 92, triglyceride 129.  Risk Assessment/Calculations:             Physical Exam:    VS:  BP 132/76 (BP Location: Left Arm, Patient Position: Sitting, Cuff Size: Normal)   Pulse 80   Ht 5' (1.524 m)   Wt 155 lb 3.2 oz (70.4 kg)   SpO2 96%   BMI 30.31 kg/m     Wt Readings from Last 3 Encounters:  12/17/22 155 lb 3.2 oz (70.4 kg)  11/15/22 155 lb (70.3 kg)  10/24/22 156 lb 12.8 oz (71.1 kg)     GEN:  Well  nourished, well developed in no acute distress HEENT: Normal NECK: No JVD; No carotid bruits CARDIAC: RRR, no murmurs, rubs, gallops RESPIRATORY:  Clear to auscultation without rales, wheezing or rhonchi  ABDOMEN: Soft, non-tender, non-distended MUSCULOSKELETAL:  No edema; No deformity  SKIN: Warm and dry NEUROLOGIC:  Alert and oriented x 3 PSYCHIATRIC:  Normal affect   ASSESSMENT:    1. Dyspnea on exertion   2. Coronary artery disease involving native coronary artery of native heart, unspecified whether angina present   3. Pure hypercholesterolemia     PLAN:    In order of problems listed above:  Dyspnea on exertion, coronary CT with minimal proximal LAD stenosis, calcium score 21.8.  EF 55 to 60% on echo, impaired laxation.  No findings to suggest etiology of shortness of breath. Minimal nonobstructive CAD.  Continue aspirin 81 mg, simvastatin to 40 mg.  Previous LDL 92.  Statin previously increased to 40 mg daily. Hyperlipidemia, simvastatin 40 mg daily.  Check lipid panel in 4 months.  Follow-up yearly     Medication Adjustments/Labs and Tests Ordered: Current medicines are reviewed at length with the patient today.  Concerns regarding medicines are outlined above.  Orders Placed This Encounter  Procedures   Lipid panel   No orders of the defined types were placed in this encounter.   Patient Instructions  Medication  Instructions:   Your physician recommends that you continue on your current medications as directed. Please refer to the Current Medication list given to you today.  *If you need a refill on your cardiac medications before your next appointment, please call your pharmacy*   Lab Work:  Your physician recommends that you return for lab work in: 4 [facility] the [facility] mall. You will need to be fasting.  No appt is needed. Hours are M-F 7AM- 6 PM.  If you have labs (blood work) drawn today and your tests are completely normal, you will receive your results only by: MyChart Message (if you have MyChart) OR A paper copy in the mail If you have any lab test that is abnormal or we need to change your treatment, we will call you to review the results.   Testing/Procedures:  1. None Ordered   Follow-Up: At [facility], you and your health needs are our priority.  As part of our continuing mission to provide you with exceptional heart care, we have created designated Provider Care Teams.  These Care Teams include your primary Cardiologist (physician) and Advanced Practice Providers (APPs -  Physician Assistants and Nurse Practitioners) who all work together to provide you with the care you need, when you need it.  We recommend signing up for the patient portal called "MyChart".  Sign up information is provided on this After Visit Summary.  MyChart is used to connect with patients for Virtual Visits (Telemedicine).  Patients are able to view lab/test results, encounter notes, upcoming appointments, etc.  Non-urgent messages can be sent to your provider as well.   To learn more about what you can do with MyChart, go to [email or web address].    Your next appointment:   12 month(s)  Provider:   You may see Debbe Odea, MD or one of the following Advanced Practice Providers on your designated Care Team:   Nicolasa Ducking, NP Eula Listen, PA-C Cadence Fransico Michael, PA-C Charlsie Quest, NP    Signed, Debbe Odea, MD  12/17/2022 11:17 AM    [facility]

## 2022-12-17 NOTE — Patient Instructions (Signed)
Medication Instructions:   Your physician recommends that you continue on your current medications as directed. Please refer to the Current Medication list given to you today.  *If you need a refill on your cardiac medications before your next appointment, please call your pharmacy*   Lab Work:  Your physician recommends that you return for lab work in: [facility]. You will need to be fasting.  No appt is needed. Hours are M-F 7AM- 6 PM.  If you have labs (blood work) drawn today and your tests are completely normal, you will receive your results only by: MyChart Message (if you have MyChart) OR A paper copy in the mail If you have any lab test that is abnormal or we need to change your treatment, we will call you to review the results.   Testing/Procedures:  1. None Ordered   Follow-Up: At [facility], you and your health needs are our priority.  As part of our continuing mission to provide you with exceptional heart care, we have created designated Provider Care Teams.  These Care Teams include your primary Cardiologist (physician) and Advanced Practice Providers (APPs -  Physician Assistants and Nurse Practitioners) who all work together to provide you with the care you need, when you need it.  We recommend signing up for the patient portal called "MyChart".  Sign up information is provided on this After Visit Summary.  MyChart is used to connect with patients for Virtual Visits (Telemedicine).  Patients are able to view lab/test results, encounter notes, upcoming appointments, etc.  Non-urgent messages can be sent to your provider as well.   To learn more about what you can do with MyChart, go to [email or web address].    Your next appointment:   12 month(s)  Provider:   You may see Debbe Odea, MD or one of the following Advanced Practice Providers on your designated Care Team:   Nicolasa Ducking, NP Eula Listen, PA-C Cadence Fransico Michael,  PA-C Charlsie Quest, NP

## 2023-01-17 ENCOUNTER — Telehealth: Payer: Self-pay | Admitting: Pulmonary Disease

## 2023-01-17 MED ORDER — METHYLPREDNISOLONE 4 MG PO TBPK: ORAL_TABLET | ORAL | 0 refills | Status: DC

## 2023-01-17 NOTE — Telephone Encounter (Signed)
PT states she has a bad cough and OTC meds not helping. Can Dr. Call in something to help elevate cough and let her sleep better?  Pharm is Therapist, occupational at [facility])  Her  # is [phone]

## 2023-01-17 NOTE — Telephone Encounter (Signed)
I spoke with the patient.  Symptoms for 3-4 days. Increased Dry Cough. Coughs during the day but worse at night. No SOB or wheezing.  Taking- Mucinex  Robitussin Trelegy- QD Albuteral inhaler and Neb- as needed. Has not used it for this cough.

## 2023-01-17 NOTE — Telephone Encounter (Signed)
I notified the patient and I have sent in the prescription.  Nothing further needed.

## 2023-01-17 NOTE — Telephone Encounter (Signed)
Takes Zyrtec at nighttime 10 mg, Medrol Dosepak.  Switch cough syrup to Delsym over-the-counter.  She is due for appointment needs to make appointment next available.

## 2023-01-22 ENCOUNTER — Other Ambulatory Visit: Payer: Self-pay | Admitting: Pulmonary Disease

## 2023-02-07 ENCOUNTER — Telehealth: Payer: Self-pay | Admitting: Pulmonary Disease

## 2023-02-07 MED ORDER — ALBUTEROL SULFATE HFA 108 (90 BASE) MCG/ACT IN AERS: 2.0000 | INHALATION_SPRAY | RESPIRATORY_TRACT | 0 refills | Status: AC | PRN

## 2023-02-07 NOTE — Telephone Encounter (Signed)
I have sent in the refill and notified the patient.  Nothing further needed. 

## 2023-02-07 NOTE — Telephone Encounter (Signed)
Pt needs albuterol inhaler sent to her pharmacy.Marland Kitchen

## 2023-02-07 NOTE — Telephone Encounter (Signed)
[place] refill for her Albuterol sent to the [facility] in [place] where she is currently at.  The phone # for that pharmacy is [phone].  If there are any further questions, please call at [phone]

## 2023-02-07 NOTE — Telephone Encounter (Signed)
ATC the patient. LVM for her to return my call. I need to know which Albuterol she needs refilled? The inhaler or Nebulizer solution.

## 2023-05-09 ENCOUNTER — Telehealth: Payer: Self-pay

## 2023-05-09 NOTE — Telephone Encounter (Signed)
Patient has appointment with you schedule for Thursday 05/16/2023. Patient first does not have a referral in the system referring her to our office. She second is a establish patient with [facility]. Called patient and informed her I would have to get the providers okay before she is seen to see if she will accept the transfer and we will also needed a referral from a provider like PCP or [facility] referring her to our office. I told her she can wait to call and get the referral till after she here back if Dr. Allegra Lai will accept the transfer or not. She states no she will call her PCP office now and get the referral placed to our office. She states she is wanting to transfer because her husbands sees you and she wants to have the same doctor as her husband. She states she like [facility] but she wants the same doctor as her husband and has went to visit with her husband and really liked you. Please advise if you will accept the transfer

## 2023-05-09 NOTE — Telephone Encounter (Signed)
Okay, I can see her  RV

## 2023-05-09 NOTE — Telephone Encounter (Signed)
Informed patient and she states she just called her PCP office about the referral and they will be faxing it over. Informed her Dr. Allegra Lai will accept the transfer.

## 2023-05-14 ENCOUNTER — Telehealth: Payer: Self-pay | Admitting: Pulmonary Disease

## 2023-05-14 MED ORDER — MONTELUKAST SODIUM 10 MG PO TABS: ORAL_TABLET | ORAL | 5 refills | Status: DC

## 2023-05-14 NOTE — Telephone Encounter (Signed)
Called and spoke to patient. She is requesting refill on Singulair.   Dr. Jayme Cloud, please advise if okay to refill?

## 2023-05-14 NOTE — Telephone Encounter (Signed)
Singulair sent to preferred pharmacy.  Patient is aware and voiced her understanding.  Nothing further needed.

## 2023-05-14 NOTE — Telephone Encounter (Signed)
Please refill montelukast (SINGULAIR) 10 MG tablet [[id number]]   [facility] on [address]  Her # is [phone]

## 2023-05-14 NOTE — Telephone Encounter (Signed)
Okay to refill montelukast (Singulair) 10 mg 1 tablet daily.

## 2023-05-15 ENCOUNTER — Encounter: Payer: Self-pay | Admitting: Ophthalmology

## 2023-05-16 ENCOUNTER — Encounter: Payer: Self-pay | Admitting: Gastroenterology

## 2023-05-16 ENCOUNTER — Ambulatory Visit (INDEPENDENT_AMBULATORY_CARE_PROVIDER_SITE_OTHER): Payer: Medicare HMO | Admitting: Gastroenterology

## 2023-05-16 VITALS — BP 160/81 | HR 73 | Temp 97.8°F | Ht 60.0 in | Wt 149.2 lb

## 2023-05-16 DIAGNOSIS — K449 Diaphragmatic hernia without obstruction or gangrene: Secondary | ICD-10-CM

## 2023-05-16 DIAGNOSIS — K5909 Other constipation: Secondary | ICD-10-CM | POA: Diagnosis not present

## 2023-05-16 DIAGNOSIS — K219 Gastro-esophageal reflux disease without esophagitis: Secondary | ICD-10-CM | POA: Diagnosis not present

## 2023-05-16 MED ORDER — DEXLANSOPRAZOLE 30 MG PO CPDR: 30.0000 mg | DELAYED_RELEASE_CAPSULE | Freq: Every day | ORAL | 0 refills | Status: DC

## 2023-05-16 NOTE — Progress Notes (Signed)
Jennifer Repress, MD [address]  [address]  [place], [place] [address]  Main: [phone]  Fax: [phone]    Gastroenterology Consultation  Referring Provider:     Marisue Ivan, MD Primary Care Physician:  Marisue Ivan, MD Primary Gastroenterologist:  Dr. Norma Fredrickson Reason for Consultation: Chronic GERD, chronic constipation        HPI:   Joliyah Wojno is a 77 y.o. female referred by Dr. Marisue Ivan, MD  for consultation & management of chronic GERD, chronic constipation.  Patient reports that she has symptoms of GERD for several years, her symptoms include heartburn, regurgitation, previously on omeprazole once a day for a long time, switched to Dexilant 60 mg daily within less than a year.  She also takes Pepcid 20 mg daily.  She does have small hiatal hernia based on the upper endoscopy in 03/2019.  She does acknowledge eating tamarind every day.  She used to drink coffee.  She also suffers from constipation, she describes her stools as hard and lumpy, like rabbit pellets, although she tries to go every day.  She does not like to eat vegetables, she has flaxseed powder, Chia seeds regularly.  She eats some fruits.  She does report abdominal bloating.  Her daughter and granddaughter tested positive for celiac disease.  Her celiac disease panel in 01/2023 was negative.  CBC, CMP, TSH normal Food allergy profile showed mild intolerance to peanuts, scallop, sesame seeds, clam.  Patient wanted to switch her care from Dr. Norma Fredrickson to me because her husband is also my patient   NSAIDs: None  Antiplts/Anticoagulants/Anti thrombotics: None  GI Procedures: Upper endoscopy 03/04/2019 - No endoscopic esophageal abnormality to explain patient' s dysphagia. - No gross lesions in esophagus. - Gastritis. Biopsied. - 1 cm hiatal hernia. - Normal examined duodenum.  DIAGNOSIS:  A. STOMACH, ANTRUM; COLD BIOPSY:  - GASTRIC ANTRAL MUCOSA WITH NO SIGNIFICANT HISTOPATHOLOGIC  CHANGE.  - NEGATIVE FOR H. PYLORI, DYSPLASIA, AND MALIGNANCY.   Colonoscopy 11/28/2016 - Internal hemorrhoids. - The examination was otherwise normal. - No specimens collected.  Past Medical History:  Diagnosis Date   Edema    FEET/LEGS   Enlarged thyroid    GERD (gastroesophageal reflux disease)    Hyperlipidemia    Hypothyroidism    Motion sickness    boats   Obesity    Obesity    Osteopenia    Palpitations    Pure hypercholesterolemia with target low density lipoprotein (LDL) cholesterol less than 130 mg/dL     Past Surgical History:  Procedure Laterality Date   ABDOMINAL HYSTERECTOMY     CATARACT EXTRACTION W/PHACO Right 09/19/2017   Procedure: CATARACT EXTRACTION PHACO AND INTRAOCULAR LENS PLACEMENT (IOC);  Surgeon: Nevada Crane, MD;  Location: [facility];  Service: Ophthalmology;  Laterality: Right;  [place] 00:17.1 AP% 8.7 CDE 1.49 Fluid Pack Lot # [id number]   CATARACT EXTRACTION W/PHACO Left 10/10/2017   Procedure: CATARACT EXTRACTION PHACO AND INTRAOCULAR LENS PLACEMENT (IOC);  Surgeon: Nevada Crane, MD;  Location: [facility];  Service: Ophthalmology;  Laterality: Left;  fluid pack lot # [id number]  exp10/31/2020 [place]   00:20.5 AP%   10.7 CDE    2.9   CESAREAN SECTION     COLONOSCOPY WITH PROPOFOL N/A 11/28/2016   Procedure: COLONOSCOPY WITH PROPOFOL;  Surgeon: Scot Jun, MD;  Location: [facility];  Service: Endoscopy;  Laterality: N/A;   ESOPHAGOGASTRODUODENOSCOPY (EGD) WITH PROPOFOL N/A 03/04/2019   Procedure: ESOPHAGOGASTRODUODENOSCOPY (EGD) WITH PROPOFOL;  Surgeon: [place],  Boykin Nearing, MD;  Location: [facility];  Service: Gastroenterology;  Laterality: N/A;   EYE SURGERY     KNEE ARTHROSCOPY Left 06/26/2018   Procedure: ARTHROSCOPY KNEE WITH PARTIAL MEDIAL MENISECTOMY;  Surgeon: Signa Kell, MD;  Location: [facility];  Service: Orthopedics;  Laterality: Left;     Current Outpatient Medications:    albuterol (PROVENTIL) (2.5 MG/3ML) 0.083%  nebulizer solution, Take 3 mLs (2.5 mg total) by nebulization every 6 (six) hours as needed for wheezing or shortness of breath., Disp: 125 mL, Rfl: 2   albuterol (VENTOLIN HFA) 108 (90 Base) MCG/ACT inhaler, Inhale 2 puffs into the lungs every 4 (four) hours as needed for wheezing or shortness of breath., Disp: 18 g, Rfl: 0   Ascorbic Acid (VITAMIN C) 100 MG tablet, Take 100 mg by mouth daily., Disp: , Rfl:    aspirin EC 81 MG tablet, Take 81 mg by mouth daily. Swallow whole., Disp: , Rfl:    Calcium Carbonate-Vit D-Min (CALCIUM 600+D PLUS MINERALS) 600-400 MG-UNIT TABS, Take 2 tablets by mouth daily., Disp: , Rfl:    Cholecalciferol (VITAMIN D-3) 25 MCG (1000 UT) CAPS, Take by mouth., Disp: , Rfl:    Dexlansoprazole (DEXILANT) 30 MG capsule DR, Take 1 capsule (30 mg total) by mouth daily., Disp: 30 capsule, Rfl: 0   famotidine (PEPCID) 20 MG tablet, TAKE 1 TABLET(20 MG) BY MOUTH EVERY NIGHT, Disp: , Rfl:    Melatonin 5 MG TABS, Take 5 mg by mouth as needed., Disp: , Rfl:    montelukast (SINGULAIR) 10 MG tablet, TAKE 1 TABLET(10 MG) BY MOUTH AT BEDTIME, Disp: 30 tablet, Rfl: 5   Multiple Vitamins-Minerals (CENTRUM SILVER 50+WOMEN PO), Take 1 tablet by mouth daily., Disp: , Rfl:    Omega-3 Fatty Acids (FISH OIL PO), Take by mouth., Disp: , Rfl:    Oyster Shell (OYSTER CALCIUM) 500 MG TABS tablet, Take 500 mg of elemental calcium by mouth daily., Disp: , Rfl:    simvastatin (ZOCOR) 40 MG tablet, Take 1 tablet (40 mg total) by mouth at bedtime. (Patient taking differently: Take 20 mg by mouth at bedtime.), Disp: 90 tablet, Rfl: 3   TRELEGY ELLIPTA 100-62.5-25 MCG/ACT AEPB, INHALE 1 PUFF INTO THE LUNGS DAILY, Disp: 60 each, Rfl: 5   Turmeric (QC TUMERIC COMPLEX PO), Take by mouth., Disp: , Rfl:    vitamin B-12 (CYANOCOBALAMIN) 100 MCG tablet, Take 100 mcg by mouth daily., Disp: , Rfl:    Zinc Acetate 25 MG CAPS, Take 25 mg by mouth daily., Disp: , Rfl:    zoledronic acid (RECLAST) 5 MG/100ML SOLN  injection, Inject 5 mg into the vein. Every year patient states, Disp: , Rfl:    Family History  Problem Relation Age of Onset   Stroke Father    Hypertension Father    Heart Problems Brother    Hypertension Brother    Breast cancer Other 44     Social History   Tobacco Use   Smoking status: Former    Current packs/day: 0.00    Average packs/day: 1 pack/day for 10.0 years (10.0 ttl pk-yrs)    Types: Cigarettes    Start date: 09/03/1988    Quit date: 09/03/1998    Years since quitting: 24.7   Smokeless tobacco: Never  Vaping Use   Vaping status: Never Used  Substance Use Topics   Alcohol use: No    Comment: rarely   Drug use: No    Allergies as of 05/16/2023 - Review Complete 05/16/2023  Allergen Reaction Noted   Tape Itching 09/30/2017    Review of Systems:    All systems reviewed and negative except where noted in HPI.   Physical Exam:  BP (!) 160/81 (BP Location: Left Arm, Patient Position: Sitting, Cuff Size: Normal)   Pulse 73   Temp 97.8 F (36.6 C) (Oral)   Ht 5' (1.524 m)   Wt 149 lb 4 oz (67.7 kg)   BMI 29.15 kg/m  No LMP recorded. Patient is postmenopausal.  General:   Alert,  Well-developed, well-nourished, pleasant and cooperative in NAD Head:  Normocephalic and atraumatic. Eyes:  Sclera clear, no icterus.   Conjunctiva pink. Ears:  Normal auditory acuity. Nose:  No deformity, discharge, or lesions. Mouth:  No deformity or lesions,oropharynx pink & moist. Neck:  Supple; no masses or thyromegaly. Lungs:  Respirations even and unlabored.  Clear throughout to auscultation.   No wheezes, crackles, or rhonchi. No acute distress. Heart:  Regular rate and rhythm; no murmurs, clicks, rubs, or gallops. Abdomen:  Normal bowel sounds. Soft, non-tender and non-distended without masses, hepatosplenomegaly or hernias noted.  No guarding or rebound tenderness.   Rectal: Not performed Msk:  Symmetrical without gross deformities. Good, equal movement & strength  bilaterally. Pulses:  Normal pulses noted. Extremities:  No clubbing or edema.  No cyanosis. Neurologic:  Alert and oriented x3;  grossly normal neurologically. Skin:  Intact without significant lesions or rashes. No jaundice. Psych:  Alert and cooperative. Normal mood and affect.  Imaging Studies: Reviewed  Assessment and Plan:   Tarini Inacio is a 77 y.o. pleasant female originally from [place] is seen in consultation for chronic GERD, chronic constipation and abdominal bloating  Chronic GERD with mild hiatal hernia, no known erosive esophagitis Advised patient to try Dexilant 30 mg daily from 60 mg, prescription sent Okay to continue Pepcid at this time Reiterated on antireflux lifestyle Advised patient to stop eating tamarind every day  Chronic constipation Diet is devoid of adequate amount of fiber Reiterated to incorporate vegetables in her meals daily Try MiraLAX and discussed about fiber supplements High-fiber diet information provided Celiac disease panel negative, no evidence of anemia, no evidence of hypothyroidism   Follow up with PA-C, Celso Amy in 3 months   Jennifer Repress, MD

## 2023-05-16 NOTE — Patient Instructions (Signed)
Colonoscopy in 2018 was completely normal so not due for 10 years.  High-Fiber Eating Plan Fiber, also called dietary fiber, is found in foods such as fruits, vegetables, whole grains, and beans. A high-fiber diet can be good for your health. Your health care provider may recommend a high-fiber diet to help: Prevent trouble pooping (constipation). Lower your cholesterol. Treat the following conditions: Hemorrhoids. This is inflammation of veins in the anus. Inflammation of specific areas of the digestive tract. Irritable bowel syndrome (IBS). This is a problem of the large intestine, also called the colon, that sometimes causes belly pain and bloating. Prevent overeating as part of a weight-loss plan. Lower the risk of heart disease, type 2 diabetes, and certain cancers. What are tips for following this plan? Reading food labels  Check the nutrition facts label on foods for the amount of dietary fiber. Choose foods that have 4 grams of fiber or more per serving. The recommended goals for how much fiber you should eat each day include: Males 73 years old or younger: 30-34 g. Males over 7 years old: 28-34 g. Females 71 years old or younger: 25-28 g. Females over 70 years old: 22-25 g. Your daily fiber goal is _____________ g. Shopping Choose whole fruits and vegetables instead of processed. For example, choose apples instead of apple juice or applesauce. Choose a variety of high-fiber foods such as avocados, lentils, oats, and pinto beans. Read the nutrition facts label on foods. Check for foods with added fiber. These foods often have high sugar and salt (sodium) amounts per serving. Cooking Use whole-grain flour for baking and cooking. Cook with brown rice instead of white rice. Make meals that have a lot of beans and vegetables in them, such as chili or vegetable-based soups. Meal planning Start the day with a breakfast that is high in fiber, such as a cereal that has 5 g of fiber or  more per serving. Eat breads and cereals that are made with whole-grain flour instead of refined flour or white flour. Eat brown rice, bulgur wheat, or millet instead of white rice. Use beans in place of meat in soups, salads, and pasta dishes. Be sure that half of the grains you eat each day are whole grains. General information You can get the recommended amount of dietary fiber by: Eating a variety of fruits, vegetables, grains, nuts, and beans. Taking a fiber supplement if you aren't able to eat enough fiber. It's better to get fiber through food than from a supplement. Slowly increase how much fiber you eat. If you increase the amount of fiber you eat too quickly, you may have bloating, cramping, or gas. Drink plenty of water to help you digest fiber. Choose high-fiber snacks, such as berries, raw vegetables, nuts, and popcorn. What foods should I eat? Fruits Berries. Pears. Apples. Oranges. Avocado. Prunes and raisins. Dried figs. Vegetables Sweet potatoes. Spinach. Kale. Artichokes. Cabbage. Broccoli. Cauliflower. Green peas. Carrots. Squash. Grains Whole-grain breads. Multigrain cereal. Oats and oatmeal. Brown rice. Barley. Bulgur wheat. Millet. Quinoa. Bran muffins. Popcorn. Rye wafer crackers. Meats and other proteins Navy beans, kidney beans, and pinto beans. Soybeans. Split peas. Lentils. Nuts and seeds. Dairy Fiber-fortified yogurt. Fortified means that fiber has been added to the product. Beverages Fiber-fortified soy milk. Fiber-fortified orange juice. Other foods Fiber bars. The items listed above may not be all the foods and drinks you can have. Talk to a dietitian to learn more. What foods should I avoid? Fruits Fruit juice. Cooked, strained fruit. Vegetables  Fried potatoes. Canned vegetables. Well-cooked vegetables. Grains White bread. Pasta made with refined flour. White rice. Meats and other proteins Fatty meat. Fried chicken or fried fish. Dairy Milk. Cream  cheese. Sour cream. Fats and oils Butters. Beverages Soft drinks. Other foods Cakes and pastries. The items listed above may not be all the foods and drinks you should avoid. Talk to a dietitian to learn more. This information is not intended to replace advice given to you by your health care provider. Make sure you discuss any questions you have with your health care provider. Document Revised: 11/12/2022 Document Reviewed: 11/12/2022 Elsevier Patient Education  2024 Else[employer] Inc. Gastroesophageal Reflux Disease, Adult  Gastroesophageal reflux (GER) happens when acid from the stomach flows up into the tube that connects the mouth and the stomach (esophagus). Normally, food travels down the esophagus and stays in the stomach to be digested. With GER, food and stomach acid sometimes move back up into the esophagus. You may have a disease called gastroesophageal reflux disease (GERD) if the reflux: Happens often. Causes frequent or very bad symptoms. Causes problems such as damage to the esophagus. When this happens, the esophagus becomes sore and swollen. Over time, GERD can make small holes (ulcers) in the lining of the esophagus. What are the causes? This condition is caused by a problem with the muscle between the esophagus and the stomach. When this muscle is weak or not normal, it does not close properly to keep food and acid from coming back up from the stomach. The muscle can be weak because of: Tobacco use. Pregnancy. Having a certain type of hernia (hiatal hernia). Alcohol use. Certain foods and drinks, such as coffee, chocolate, onions, and peppermint. What increases the risk? Being overweight. Having a disease that affects your connective tissue. Taking NSAIDs, such a ibuprofen. What are the signs or symptoms? Heartburn. Difficult or painful swallowing. The feeling of having a lump in the throat. A bitter taste in the mouth. Bad breath. Having a lot of saliva. Having  an upset or bloated stomach. Burping. Chest pain. Different conditions can cause chest pain. Make sure you see your doctor if you have chest pain. Shortness of breath or wheezing. A long-term cough or a cough at night. Wearing away of the surface of teeth (tooth enamel). Weight loss. How is this treated? Making changes to your diet. Taking medicine. Having surgery. Treatment will depend on how bad your symptoms are. Follow these instructions at home: Eating and drinking  Follow a diet as told by your doctor. You may need to avoid foods and drinks such as: Coffee and tea, with or without caffeine. Drinks that contain alcohol. Energy drinks and sports drinks. Bubbly (carbonated) drinks or sodas. Chocolate and cocoa. Peppermint and mint flavorings. Garlic and onions. Horseradish. Spicy and acidic foods. These include peppers, chili powder, curry powder, vinegar, hot sauces, and BBQ sauce. Citrus fruit juices and citrus fruits, such as oranges, lemons, and limes. Tomato-based foods. These include red sauce, chili, salsa, and pizza with red sauce. Fried and fatty foods. These include donuts, french fries, potato chips, and high-fat dressings. High-fat meats. These include hot dogs, rib eye steak, sausage, ham, and bacon. High-fat dairy items, such as whole milk, butter, and cream cheese. Eat small meals often. Avoid eating large meals. Avoid drinking large amounts of liquid with your meals. Avoid eating meals during the 2-3 hours before bedtime. Avoid lying down right after you eat. Do not exercise right after you eat. Lifestyle  Do not  smoke or use any products that contain nicotine or tobacco. If you need help quitting, ask your doctor. Try to lower your stress. If you need help doing this, ask your doctor. If you are overweight, lose an amount of weight that is healthy for you. Ask your doctor about a safe weight loss goal. General instructions Pay attention to any changes in  your symptoms. Take over-the-counter and prescription medicines only as told by your doctor. Do not take aspirin, ibuprofen, or other NSAIDs unless your doctor says it is okay. Wear loose clothes. Do not wear anything tight around your waist. Raise (elevate) the head of your bed about 6 inches (15 cm). You may need to use a wedge to do this. Avoid bending over if this makes your symptoms worse. Keep all follow-up visits. Contact a doctor if: You have new symptoms. You lose weight and you do not know why. You have trouble swallowing or it hurts to swallow. You have wheezing or a cough that keeps happening. You have a hoarse voice. Your symptoms do not get better with treatment. Get help right away if: You have sudden pain in your arms, neck, jaw, teeth, or back. You suddenly feel sweaty, dizzy, or light-headed. You have chest pain or shortness of breath. You vomit and the vomit is green, yellow, or black, or it looks like blood or coffee grounds. You faint. Your poop (stool) is red, bloody, or black. You cannot swallow, drink, or eat. These symptoms may represent a serious problem that is an emergency. Do not wait to see if the symptoms will go away. Get medical help right away. Call your local emergency services (911 in the [place] Do not drive yourself to the hospital. Summary If a person has gastroesophageal reflux disease (GERD), food and stomach acid move back up into the esophagus and cause symptoms or problems such as damage to the esophagus. Treatment will depend on how bad your symptoms are. Follow a diet as told by your doctor. Take all medicines only as told by your doctor. This information is not intended to replace advice given to you by your health care provider. Make sure you discuss any questions you have with your health care provider. Document Revised: 02/29/2020 Document Reviewed: 02/29/2020 Elsevier Patient Education  2024 [employer].

## 2023-05-22 NOTE — Discharge Instructions (Signed)
INSTRUCTIONS FOLLOWING OCULOPLASTIC SURGERY AMY Tanda Rockers, MD  AFTER YOUR EYE SURGERY, THER ARE MANY THINGS WHICH YOU, THE PATIENT, CAN DO TO ASSURE THE BEST POSSIBLE RESULT FROM YOUR OPERATION.  THIS SHEET SHOULD BE REFERRED TO WHENEVER QUESTIONS ARISE.  IF THERE ARE ANY QUESTIONS NOT ANSWERED HERE, DO NOT HESITATE TO CALL OUR OFFICE AT [phone] OR [phone].  THERE IS ALWAYS SOMEONE AVAILABLE TO CALL IF QUESTIONS OR PROBLEMS ARISE.  VISION: Your vision may be blurred and out of focus after surgery until you are able to stop using your ointment, swelling resolves and your eye(s) heal. This may take 1 to 2 weeks at the least.  If your vision becomes gradually more dim or dark, this is not normal and you need to call our office immediately.  EYE CARE: For the first 48 hours after surgery, use ice packs frequently - "20 minutes on, 20 minutes off" - to help reduce swelling and bruising.  Small bags of frozen peas or corn make good ice packs along with cloths soaked in ice water.  If you are wearing a patch or other type of dressing following surgery, keep this on for the amount of time specified by your doctor.  For the first week following surgery, you will need to treat your stitches with great care.  It is OK to shower, but take care to not allow soapy water to run into your eye(s) to help reduce chances of infection.  You may gently clean the eyelashes and around the eye(s) with cotton balls and bottled water, BUT DO NOT RUB THE STITCHES VIGOROUSLY.  Keeping your stitches moist with ointment will help promote healing with minimal scar formation.  ACTIVITY: When you leave the surgery center, you should go home, rest and be inactive.  The eye(s) may feel scratchy and keeping the eyes closed will allow for faster healing.  The first week following surgery, avoid straining (anything making the face turn red) or lifting over 20 pounds.  Additionally, avoid bending which causes your head to go below  your waist.  Using your eyes will NOT harm them, so feel free to read, watch television, use the computer, etc as desired.  Driving depends on each individual, so check with your doctor if you have questions about driving. Do not wear contact lenses for about 2 weeks.  Do not wear eye makeup for 2 weeks.  Avoid swimming, hot tubs, gardening, and dusting for 1 to 2 weeks to reduce the risk of an infection.  MEDICATIONS:  You will be given a prescription for an ointment to use 4 times a day on your stitches.  You can use the ointment in your eyes if they feel scratchy or irritated.  If you eyelid(s) don't close completely when you sleep, put some ointment in your eyes before bedtime.  EMERGENCY: If you experience SEVERE EYE PAIN OR HEADACHE UNRELIEVED BY TYLENOL OR TRAMADOL, NAUSEA OR VOMITING, WORSENING REDNESS, OR WORSENING VISION (ESPECIALLY VISION THAT WAS INITIALLY BETTER) CALL [phone] OR [phone] DURING BUSINESS HOURS OR AFTER HOURS.

## 2023-05-24 ENCOUNTER — Ambulatory Visit
Admission: RE | Admit: 2023-05-24 | Discharge: 2023-05-24 | Disposition: A | Discharge status: Home / Self Care | Payer: Medicare HMO | Attending: Ophthalmology | Admitting: Ophthalmology

## 2023-05-24 ENCOUNTER — Encounter: Payer: Self-pay | Admitting: Ophthalmology

## 2023-05-24 ENCOUNTER — Ambulatory Visit: Payer: Medicare HMO | Admitting: Anesthesiology

## 2023-05-24 ENCOUNTER — Other Ambulatory Visit: Payer: Self-pay

## 2023-05-24 ENCOUNTER — Encounter
Admission: RE | Disposition: A | Discharge status: Home / Self Care | Payer: Self-pay | Source: Home / Self Care | Attending: Ophthalmology

## 2023-05-24 DIAGNOSIS — K219 Gastro-esophageal reflux disease without esophagitis: Secondary | ICD-10-CM | POA: Diagnosis not present

## 2023-05-24 DIAGNOSIS — J45909 Unspecified asthma, uncomplicated: Secondary | ICD-10-CM | POA: Diagnosis not present

## 2023-05-24 DIAGNOSIS — E039 Hypothyroidism, unspecified: Secondary | ICD-10-CM | POA: Diagnosis not present

## 2023-05-24 DIAGNOSIS — H02834 Dermatochalasis of left upper eyelid: Secondary | ICD-10-CM | POA: Insufficient documentation

## 2023-05-24 DIAGNOSIS — Z6828 Body mass index (BMI) 28.0-28.9, adult: Secondary | ICD-10-CM | POA: Insufficient documentation

## 2023-05-24 DIAGNOSIS — Z7951 Long term (current) use of inhaled steroids: Secondary | ICD-10-CM | POA: Insufficient documentation

## 2023-05-24 DIAGNOSIS — E669 Obesity, unspecified: Secondary | ICD-10-CM | POA: Diagnosis not present

## 2023-05-24 DIAGNOSIS — M8580 Other specified disorders of bone density and structure, unspecified site: Secondary | ICD-10-CM | POA: Diagnosis not present

## 2023-05-24 DIAGNOSIS — H02831 Dermatochalasis of right upper eyelid: Secondary | ICD-10-CM | POA: Insufficient documentation

## 2023-05-24 DIAGNOSIS — Z87891 Personal history of nicotine dependence: Secondary | ICD-10-CM | POA: Diagnosis not present

## 2023-05-24 HISTORY — PX: BROW LIFT: SHX178

## 2023-05-24 SURGERY — BLEPHAROPLASTY
Anesthesia: Monitor Anesthesia Care | Laterality: Bilateral

## 2023-05-24 MED ORDER — MIDAZOLAM HCL 2 MG/2ML IJ SOLN
INTRAMUSCULAR | Status: AC
  Filled 2023-05-24: qty 2

## 2023-05-24 MED ORDER — HYALURONIDASE HUMAN 150 UNIT/ML IJ SOLN
INTRAMUSCULAR | Status: AC
  Filled 2023-05-24: qty 1

## 2023-05-24 MED ORDER — TETRACAINE HCL 0.5 % OP SOLN
OPHTHALMIC | Status: DC | PRN
  Administered 2023-05-24: 2 [drp] via OPHTHALMIC

## 2023-05-24 MED ORDER — DEXMEDETOMIDINE HCL IN NACL 80 MCG/20ML IV SOLN
INTRAVENOUS | Status: AC
  Filled 2023-05-24: qty 20

## 2023-05-24 MED ORDER — ERYTHROMYCIN 5 MG/GM OP OINT: TOPICAL_OINTMENT | OPHTHALMIC | 2 refills | Status: DC

## 2023-05-24 MED ORDER — DEXMEDETOMIDINE HCL IN NACL 200 MCG/50ML IV SOLN
INTRAVENOUS | Status: DC | PRN
Start: 2023-05-24 — End: 2023-05-24
  Administered 2023-05-24 (×2): 8 ug via INTRAVENOUS

## 2023-05-24 MED ORDER — TRAMADOL HCL 50 MG PO TABS
ORAL_TABLET | ORAL | 0 refills | Status: DC
Start: 2023-05-24 — End: 2024-06-23

## 2023-05-24 MED ORDER — PROPOFOL 1000 MG/100ML IV EMUL
INTRAVENOUS | Status: AC
  Filled 2023-05-24: qty 100

## 2023-05-24 MED ORDER — LIDOCAINE-EPINEPHRINE 2 %-1:100000 IJ SOLN
INTRAMUSCULAR | Status: DC | PRN
  Administered 2023-05-24: 2.5 mL via OPHTHALMIC

## 2023-05-24 MED ORDER — PROPOFOL 500 MG/50ML IV EMUL
INTRAVENOUS | Status: DC | PRN
  Administered 2023-05-24: 40 mg via INTRAVENOUS
  Administered 2023-05-24: 50 ug/kg/min via INTRAVENOUS

## 2023-05-24 MED ORDER — ONDANSETRON HCL 4 MG/2ML IJ SOLN
INTRAMUSCULAR | Status: DC | PRN
  Administered 2023-05-24: 4 mg via INTRAVENOUS

## 2023-05-24 MED ORDER — FENTANYL CITRATE (PF) 100 MCG/2ML IJ SOLN
INTRAMUSCULAR | Status: AC
  Filled 2023-05-24: qty 2

## 2023-05-24 MED ORDER — BSS IO SOLN
INTRAOCULAR | Status: DC | PRN
  Administered 2023-05-24: 15 mL via INTRAOCULAR

## 2023-05-24 MED ORDER — FENTANYL CITRATE (PF) 100 MCG/2ML IJ SOLN
INTRAMUSCULAR | Status: DC | PRN
  Administered 2023-05-24 (×2): 25 ug via INTRAVENOUS
  Administered 2023-05-24: 50 ug via INTRAVENOUS

## 2023-05-24 MED ORDER — MIDAZOLAM HCL 2 MG/2ML IJ SOLN
INTRAMUSCULAR | Status: DC | PRN
  Administered 2023-05-24: 2 mg via INTRAVENOUS

## 2023-05-24 MED ORDER — ERYTHROMYCIN 5 MG/GM OP OINT
TOPICAL_OINTMENT | OPHTHALMIC | Status: DC | PRN
  Administered 2023-05-24: 1 via OPHTHALMIC

## 2023-05-24 MED ORDER — LACTATED RINGERS IV SOLN: INTRAVENOUS | Status: DC

## 2023-05-24 SURGICAL SUPPLY — 34 items
APPLICATOR COTTON TIP WD 3 STR (MISCELLANEOUS) ×1 IMPLANT
BLADE SURG 15 STRL LF DISP TIS (BLADE) ×1 IMPLANT
BLADE SURG 15 STRL SS (BLADE) ×1
CORD BIP STRL DISP 12FT (MISCELLANEOUS) ×1 IMPLANT
GAUZE SPONGE 2X2 STRL 8-PLY (GAUZE/BANDAGES/DRESSINGS) ×10 IMPLANT
GAUZE SPONGE 4X4 12PLY STRL (GAUZE/BANDAGES/DRESSINGS) ×1 IMPLANT
GLOVE SURG UNDER POLY LF SZ7 (GLOVE) ×2 IMPLANT
GOWN STRL REUS W/ TWL LRG LVL3 (GOWN DISPOSABLE) ×1 IMPLANT
GOWN STRL REUS W/TWL LRG LVL3 (GOWN DISPOSABLE) ×1
MARKER SKIN XFINE TIP W/RULER (MISCELLANEOUS) ×1 IMPLANT
NDL FILTER BLUNT 18X1 1/2 (NEEDLE) ×1 IMPLANT
NDL HYPO 30X.5 LL (NEEDLE) ×2 IMPLANT
NEEDLE FILTER BLUNT 18X1 1/2 (NEEDLE) ×1 IMPLANT
NEEDLE HYPO 30X.5 LL (NEEDLE) ×2 IMPLANT
PACK ENT CUSTOM (PACKS) ×1 IMPLANT
SOL PREP PVP 2OZ (MISCELLANEOUS) ×1
SOLUTION PREP PVP 2OZ (MISCELLANEOUS) ×1 IMPLANT
SUT CHROMIC 4-0 (SUTURE)
SUT CHROMIC 4-0 M2 12X2 ARM (SUTURE)
SUT CHROMIC 5 0 P 3 (SUTURE) IMPLANT
SUT ETHILON 4 0 CL P 3 (SUTURE) IMPLANT
SUT GUT PLAIN 6-0 1X18 ABS (SUTURE) ×1 IMPLANT
SUT MERSILENE 4-0 S-2 (SUTURE) IMPLANT
SUT PROLENE 5 0 P 3 (SUTURE) IMPLANT
SUT PROLENE 6 0 P 1 18 (SUTURE) IMPLANT
SUT SILK 4 0 G 3 (SUTURE) IMPLANT
SUT VIC AB 5-0 P-3 18X BRD (SUTURE) IMPLANT
SUT VIC AB 5-0 P3 18 (SUTURE)
SUT VICRYL 6-0 S14 CTD (SUTURE) IMPLANT
SUT VICRYL 7 0 TG140 8 (SUTURE) IMPLANT
SUTURE CHRMC 4-0 M2 12X2 ARM (SUTURE) IMPLANT
SYR 10ML LL (SYRINGE) ×1 IMPLANT
SYR 3ML LL SCALE MARK (SYRINGE) ×1 IMPLANT
WATER STERILE IRR 250ML POUR (IV SOLUTION) ×1 IMPLANT

## 2023-05-24 NOTE — Transfer of Care (Signed)
Immediate Anesthesia Transfer of Care Note  Patient: Jennifer Farmer  Procedure(s) Performed: BLEPHAROPLASTY UPPER EYELID; W/ EXCESS SKIN BILATERAL (Bilateral)  Patient Location: PACU  Anesthesia Type: MAC  Level of Consciousness: awake, alert  and patient cooperative  Airway and Oxygen Therapy: Patient Spontanous Breathing and Patient connected to supplemental oxygen  Post-op Assessment: Post-op Vital signs reviewed, Patient's Cardiovascular Status Stable, Respiratory Function Stable, Patent Airway and No signs of Nausea or vomiting  Post-op Vital Signs: Reviewed and stable  Complications: No notable events documented.

## 2023-05-24 NOTE — Op Note (Signed)
Preoperative Diagnosis:  Visually significant dermatochalasis bilateral  Upper Eyelid(s)  Postoperative Diagnosis:  Same.  Procedure(s) Performed:   Upper eyelid blepharoplasty with excess skin excision  bilateral  Upper Eyelid(s)  Surgeon: Hubbard Robinson. Ether Griffins, M.D.  Assistants: none  Anesthesia: MAC  Specimens: None.  Estimated Blood Loss: Minimal.  Complications: None.  Operative Findings: None Dictated  Procedure:   Allergies were reviewed and the patient is allergic to Tape.   After the risks, benefits, complications and alternatives were discussed with the patient, appropriate informed consent was obtained and the patient was brought to the operating suite. The patient was reclined supine and a timeout was conducted.  The patient was then sedated.  Local anesthetic consisting of a 50-50 mixture of 2% lidocaine with epinephrine and 0.75% bupivacaine with added Hylenex was injected subcutaneously to both  upper eyelid(s). After adequate local was instilled, the patient was prepped and draped in the usual sterile fashion for eyelid surgery.   Attention was turned to the upper eyelids. A 9mm upper eyelid crease incision line was marked with calipers on both  upper eyelid(s).  A pinch test was used to estimate the amount of excess skin to remove and this was marked in standard blepharoplasty style fashion. Attention was turned to the  both  upper eyelid. A #15 blade was used to open the premarked incision line. A Skin and muscle flap was excised and hemostasis was obtained with bipolar cautery.   Attention was then turned to the opposite eyelid where the same procedure was performed in the same manner. Hemostasis was obtained with bipolar cautery throughout. All incisions were then closed with a combination of running and interrupted 6-0 fast absorbing plain suture. The patient tolerated the procedure well.  Erythromycin ophthalmic ointment was applied to her incision sites, followed by  ice packs. She was taken to the recovery area where she recovered without difficulty.  Post-Op Plan/Instructions:  The patient was instructed to use ice packs frequently for the next 48 hours. She was instructed to use Erythromycin ophthalmic ointment on her incisions 4 times a day for the next 12 to 14 days. She was given a prescription for tramadol (or similar) for pain control should Tylenol not be effective. She was asked to to follow up in 2-3 weeks' time at the [facility] in [place], [place] or sooner as needed for problems.  Amal Renbarger M. Ether Griffins, M.D. Ophthalmology

## 2023-05-24 NOTE — Anesthesia Postprocedure Evaluation (Signed)
Anesthesia Post Note  Patient: Jennifer Farmer  Procedure(s) Performed: BLEPHAROPLASTY UPPER EYELID; W/ EXCESS SKIN BILATERAL (Bilateral)  Patient location during evaluation: PACU Anesthesia Type: MAC Level of consciousness: awake and alert Pain management: pain level controlled Vital Signs Assessment: post-procedure vital signs reviewed and stable Respiratory status: spontaneous breathing, nonlabored ventilation, respiratory function stable and patient connected to nasal cannula oxygen Cardiovascular status: stable and blood pressure returned to baseline Postop Assessment: no apparent nausea or vomiting Anesthetic complications: no   No notable events documented.   Last Vitals:  Vitals:   05/24/23 1036 05/24/23 1100  BP: 119/69 130/76  Pulse: 72 76  Resp: 16   Temp: (!) 36.1 C 36.7 C  SpO2: 97% 98%    Last Pain:  Vitals:   05/24/23 1100  TempSrc:   PainSc: 0-No pain                 Jennifer Farmer

## 2023-05-24 NOTE — H&P (Signed)
[facility]: [facility]  Primary Care Physician:  Marisue Ivan, MD Ophthalmologist: Dr. Hubbard Robinson. Ether Griffins, M.D.  Pre-Procedure History & Physical: HPI:  Jennifer Farmer is a 77 y.o. female here for periocular surgery.   Past Medical History:  Diagnosis Date   Edema    FEET/LEGS   Enlarged thyroid    GERD (gastroesophageal reflux disease)    Hyperlipidemia    Hypothyroidism    Motion sickness    boats   Obesity    Obesity    Osteopenia    Palpitations    Pure hypercholesterolemia with target low density lipoprotein (LDL) cholesterol less than 130 mg/dL     Past Surgical History:  Procedure Laterality Date   ABDOMINAL HYSTERECTOMY     CATARACT EXTRACTION W/PHACO Right 09/19/2017   Procedure: CATARACT EXTRACTION PHACO AND INTRAOCULAR LENS PLACEMENT (IOC);  Surgeon: Nevada Crane, MD;  Location: [facility] ORS;  Service: Ophthalmology;  Laterality: Right;  [place] 00:17.1 AP% 8.7 CDE 1.49 Fluid Pack Lot # [id number]   CATARACT EXTRACTION W/PHACO Left 10/10/2017   Procedure: CATARACT EXTRACTION PHACO AND INTRAOCULAR LENS PLACEMENT (IOC);  Surgeon: Nevada Crane, MD;  Location: [facility] ORS;  Service: Ophthalmology;  Laterality: Left;  fluid pack lot # [id number]  exp10/31/2020 [place]   00:20.5 AP%   10.7 CDE    2.9   CESAREAN SECTION     COLONOSCOPY WITH PROPOFOL N/A 11/28/2016   Procedure: COLONOSCOPY WITH PROPOFOL;  Surgeon: Scot Jun, MD;  Location: [facility] ENDOSCOPY;  Service: Endoscopy;  Laterality: N/A;   ESOPHAGOGASTRODUODENOSCOPY (EGD) WITH PROPOFOL N/A 03/04/2019   Procedure: ESOPHAGOGASTRODUODENOSCOPY (EGD) WITH PROPOFOL;  Surgeon: [place], Boykin Nearing, MD;  Location: [facility] ENDOSCOPY;  Service: Gastroenterology;  Laterality: N/A;   EYE SURGERY     KNEE ARTHROSCOPY Left 06/26/2018   Procedure: ARTHROSCOPY KNEE WITH PARTIAL MEDIAL MENISECTOMY;  Surgeon: Signa Kell, MD;  Location: [facility];  Service: Orthopedics;  Laterality: Left;    Prior to Admission  medications   Medication Sig Start Date End Date Taking? Authorizing Provider  albuterol (PROVENTIL) (2.5 MG/3ML) 0.083% nebulizer solution Take 3 mLs (2.5 mg total) by nebulization every 6 (six) hours as needed for wheezing or shortness of breath. 07/11/22  Yes Salena Saner, MD  albuterol (VENTOLIN HFA) 108 (90 Base) MCG/ACT inhaler Inhale 2 puffs into the lungs every 4 (four) hours as needed for wheezing or shortness of breath. 02/07/23  Yes Salena Saner, MD  Ascorbic Acid (VITAMIN C) 100 MG tablet Take 100 mg by mouth daily.   Yes [provider]  aspirin EC 81 MG tablet Take 81 mg by mouth daily. Swallow whole.   Yes [provider]  Calcium Carbonate-Vit D-Min (CALCIUM 600+D PLUS MINERALS) 600-400 MG-UNIT TABS Take 2 tablets by mouth daily.   Yes [provider]  Cholecalciferol (VITAMIN D-3) 25 MCG (1000 UT) CAPS Take by mouth.   Yes [provider]  Dexlansoprazole (DEXILANT) 30 MG capsule DR Take 1 capsule (30 mg total) by mouth daily. 05/16/23  Yes Vanga, Loel Dubonnet, MD  famotidine (PEPCID) 20 MG tablet TAKE 1 TABLET(20 MG) BY MOUTH EVERY NIGHT 07/12/21  Yes [provider]  Melatonin 5 MG TABS Take 5 mg by mouth as needed.   Yes [provider]  montelukast (SINGULAIR) 10 MG tablet TAKE 1 TABLET(10 MG) BY MOUTH AT BEDTIME 05/14/23  Yes Salena Saner, MD  Multiple Vitamins-Minerals (CENTRUM SILVER 50+WOMEN PO) Take 1 tablet by mouth daily.   Yes [provider]  Omega-3 Fatty Acids (FISH OIL PO) Take by mouth.   Yes [provider]  Ethelda Chick (OYSTER CALCIUM) 500 MG TABS tablet Take 500 mg of elemental calcium by mouth daily.   Yes [provider]  simvastatin (ZOCOR) 40 MG tablet Take 1 tablet (40 mg total) by mouth at bedtime. Patient taking differently: Take 20 mg by mouth at bedtime. 10/24/22 10/19/23 Yes Agbor-Etang, Arlys John, MD  TRELEGY ELLIPTA 100-62.5-25 MCG/ACT AEPB INHALE 1 PUFF INTO THE  LUNGS DAILY 01/22/23  Yes Salena Saner, MD  Turmeric (QC TUMERIC COMPLEX PO) Take by mouth.   Yes [provider]  vitamin B-12 (CYANOCOBALAMIN) 100 MCG tablet Take 100 mcg by mouth daily.   Yes [provider]  Zinc Acetate 25 MG CAPS Take 25 mg by mouth daily.   Yes [provider]  zoledronic acid (RECLAST) 5 MG/100ML SOLN injection Inject 5 mg into the vein. Every year patient states   Yes [provider]    Allergies as of 01/21/2023 - Review Complete 12/17/2022  Allergen Reaction Noted   Tape Itching 09/30/2017    Family History  Problem Relation Age of Onset   Stroke Father    Hypertension Father    Heart Problems Brother    Hypertension Brother    Breast cancer Other 35    Social History   Socioeconomic History   Marital status: Married    Spouse name: Not on file   Number of children: Not on file   Years of education: Not on file   Highest education level: Not on file  Occupational History   Not on file  Tobacco Use   Smoking status: Former    Current packs/day: 0.00    Average packs/day: 1 pack/day for 10.0 years (10.0 ttl pk-yrs)    Types: Cigarettes    Start date: 09/03/1988    Quit date: 09/03/1998    Years since quitting: 24.7   Smokeless tobacco: Never  Vaping Use   Vaping status: Never Used  Substance and Sexual Activity   Alcohol use: No    Comment: rarely   Drug use: No   Sexual activity: Not on file  Other Topics Concern   Not on file  Social History Narrative   Not on file   Social Determinants of Health   Financial Resource Strain: Low Risk  (01/10/2023)   Received from [facility], [facility]   Overall Financial Resource Strain (CARDIA)    Difficulty of Paying Living Expenses: Not hard at all  Food Insecurity: No Food Insecurity (01/10/2023)   Received from [facility], [facility]   Hunger Vital Sign    Worried About Running Out  of Food in the Last Year: Never true    Ran Out of Food in the Last Year: Never true  Transportation Needs: No Transportation Needs (01/10/2023)   Received from [facility], [facility]   [facility] - Transportation    In the past 12 months, has lack of transportation kept you from medical appointments or from getting medications?: No    Lack of Transportation (Non-Medical): No  Physical Activity: Not on file  Stress: Not on file  Social Connections: Not on file  Intimate Partner Violence: Not on file    Review of Systems: See HPI, otherwise negative ROS  Physical Exam: BP (!) 157/75   Temp 98.3 F (36.8 C) (Temporal)   Resp 16  Ht 5' (1.524 m)   Wt 66.5 kg   SpO2 98%   BMI 28.63 kg/m  General:   Alert and cooperative in NAD Head:  Normocephalic and atraumatic. Respiratory:  Normal work of breathing.  Impression/Plan: Jennifer Farmer is here for periocular surgery.  Risks, benefits, limitations, and alternatives regarding surgery have been reviewed with the patient.  Questions have been answered.  All parties agreeable.   Imagene Riches, MD  05/24/2023, 9:26 AM

## 2023-05-24 NOTE — Anesthesia Preprocedure Evaluation (Signed)
Anesthesia Evaluation  Patient identified by MRN, date of birth, ID band Patient awake    Reviewed: Allergy & Precautions, H&P , NPO status , Patient's Chart, lab work & pertinent test results  Airway Mallampati: III  TM Distance: >3 FB Neck ROM: Full    Dental no notable dental hx.    Pulmonary neg pulmonary ROS, asthma , former smoker   Pulmonary exam normal breath sounds clear to auscultation       Cardiovascular negative cardio ROS Normal cardiovascular exam Rhythm:Regular Rate:Normal     Neuro/Psych negative neurological ROS  negative psych ROS   GI/Hepatic negative GI ROS, Neg liver ROS,GERD  ,,  Endo/Other  negative endocrine ROSHypothyroidism    Renal/GU negative Renal ROS  negative genitourinary   Musculoskeletal negative musculoskeletal ROS (+)    Abdominal   Peds negative pediatric ROS (+)  Hematology negative hematology ROS (+)   Anesthesia Other Findings Medical History  Enlarged thyroid  GERD (gastroesophageal reflux disease) Hyperlipidemia  Osteopenia  Obesity Pure hypercholesterolemia with target low density lipoprotein (LDL) cholesterol less than 130 mg/dL  Hypothyroidism Palpitations  Edema Motion sickness     Reproductive/Obstetrics negative OB ROS                             Anesthesia Physical Anesthesia Plan  ASA: 2  Anesthesia Plan: MAC   Post-op Pain Management:    Induction: Intravenous  PONV Risk Score and Plan:   Airway Management Planned: Natural Airway and Nasal Cannula  Additional Equipment:   Intra-op Plan:   Post-operative Plan:   Informed Consent: I have reviewed the patients History and Physical, chart, labs and discussed the procedure including the risks, benefits and alternatives for the proposed anesthesia with the patient or authorized representative who has indicated his/her understanding and acceptance.     Dental  Advisory Given  Plan Discussed with: Anesthesiologist, CRNA and Surgeon  Anesthesia Plan Comments: (Patient consented for risks of anesthesia including but not limited to:  - adverse reactions to medications - damage to eyes, teeth, lips or other oral mucosa - nerve damage due to positioning  - sore throat or hoarseness - Damage to heart, brain, nerves, lungs, other parts of body or loss of life  Patient voiced understanding.)       Anesthesia Quick Evaluation

## 2023-05-24 NOTE — Interval H&P Note (Signed)
History and Physical Interval Note:  05/24/2023 9:27 AM  Jennifer Farmer  has presented today for surgery, with the diagnosis of H02.831 Dermatochalasis of Right Upper Eyelid H02.834 Dermatochalasis of Left Upper Eyelid.  The various methods of treatment have been discussed with the patient and family. After consideration of risks, benefits and other options for treatment, the patient has consented to  Procedure(s): BLEPHAROPLASTY UPPER EYELID; W/ EXCESS SKIN BILATERAL (Bilateral) as a surgical intervention.  The patient's history has been reviewed, patient examined, no change in status, stable for surgery.  I have reviewed the patient's chart and labs.  Questions were answered to the patient's satisfaction.     Ether Griffins, Stefanee Mckell M

## 2023-05-27 ENCOUNTER — Encounter: Payer: Self-pay | Admitting: Ophthalmology

## 2023-06-10 ENCOUNTER — Telehealth: Payer: Self-pay

## 2023-06-10 MED ORDER — DEXLANSOPRAZOLE 30 MG PO CPDR: 30.0000 mg | DELAYED_RELEASE_CAPSULE | Freq: Every day | ORAL | 1 refills | Status: DC

## 2023-06-10 NOTE — Telephone Encounter (Signed)
Last office visit 05/16/2023 chronic Jennifer Farmer  Last refill 05/16/23 0 refills  Plan: Chronic GERD with mild hiatal hernia, no known erosive esophagitis Advised patient to try Dexilant 30 mg daily from 60 mg, prescription sent Okay to continue Pepcid at this time Reiterated on antireflux lifestyle Advised patient to stop eating tamarind every day

## 2023-07-09 ENCOUNTER — Ambulatory Visit (INDEPENDENT_AMBULATORY_CARE_PROVIDER_SITE_OTHER): Payer: Medicare HMO | Admitting: Pulmonary Disease

## 2023-07-09 ENCOUNTER — Encounter: Payer: Self-pay | Admitting: Pulmonary Disease

## 2023-07-09 VITALS — BP 134/82 | HR 83 | Temp 97.8°F | Ht 60.0 in | Wt 148.0 lb

## 2023-07-09 DIAGNOSIS — Z23 Encounter for immunization: Secondary | ICD-10-CM | POA: Diagnosis not present

## 2023-07-09 DIAGNOSIS — J8489 Other specified interstitial pulmonary diseases: Secondary | ICD-10-CM

## 2023-07-09 DIAGNOSIS — R053 Chronic cough: Secondary | ICD-10-CM | POA: Diagnosis not present

## 2023-07-09 DIAGNOSIS — J4540 Moderate persistent asthma, uncomplicated: Secondary | ICD-10-CM

## 2023-07-09 DIAGNOSIS — K219 Gastro-esophageal reflux disease without esophagitis: Secondary | ICD-10-CM

## 2023-07-09 LAB — NITRIC OXIDE: Nitric Oxide: 7

## 2023-07-09 MED ORDER — TRELEGY ELLIPTA 100-62.5-25 MCG/ACT IN AEPB: 1.0000 | INHALATION_SPRAY | Freq: Every day | RESPIRATORY_TRACT | 6 refills | Status: DC

## 2023-07-09 NOTE — Patient Instructions (Signed)
VISIT SUMMARY:  Today, we discussed your chronic cough, which may be related to a ragweed allergy. We also reviewed your history of lung inflammation and reflux. You are currently managing your symptoms with medications and received your flu shot.  YOUR PLAN:  -CHRONIC COUGH: Your chronic cough is likely due to gastroesophageal reflux disease (GERD) and a ragweed allergy. GERD is a condition where stomach acid frequently flows back into the tube connecting your mouth and stomach, causing irritation. You should continue taking your current medications, Pepcid and Nexium, for GERD and Zyrtec for allergies. We will check your blood work in January to investigate other potential causes and repeat a high-resolution CT chest scan to monitor your lung condition.  -NON-SPECIFIC INTERSTITIAL PNEUMONIA (NSIP): NSIP is a type of lung inflammation that can cause scarring and breathing difficulties. Your previous CT scan showed mild changes, but you currently have no respiratory distress. We will repeat the high-resolution CT chest scan and pulmonary function tests in January to monitor your lung function.  -GENERAL HEALTH MAINTENANCE: We will administer your influenza vaccine today. Routine labs and studies are normal. Please continue with a healthy lifestyle, including a balanced diet and regular exercise.  INSTRUCTIONS:  Please follow up with [place] at the end of January or early February for your next appointment. We will conduct blood work, a high-resolution CT chest scan, and pulmonary function tests at that time.

## 2023-07-09 NOTE — Progress Notes (Signed)
Subjective:    Patient ID: Jennifer Farmer, female    DOB: 24-Jul-1946, 77 y.o.   MRN: [id number]  Patient Care Team: Marisue Ivan, MD as PCP - General (Family Medicine) Debbe Odea, MD as PCP - Cardiology (Cardiology)  Chief Complaint  Patient presents with   Follow-up    No SOB. Some wheezing at night. Dry cough.     BACKGROUND/INTERVAL:Jennifer Farmer is a 77 year old remote former smoker whom we last saw 18 July 2022.  She has a issues with chronic cough this is believed to be due to asthma/asthmatic bronchitis. 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  She has noted exacerbation of her chronic cough since October which correlates with the ragweed/goldenrod season.  HPI Discussed the use of AI scribe software for clinical note transcription with the patient, who gave verbal consent to proceed.  History of Present Illness   Jennifer Farmer, a 77 year old remote former smoker, presents with exacerbation a chronic cough that began in mid-October. The patient suspects the cough may be related to a ragweed allergy, as she noticed an increase in coughing after exposure to the plant. The cough is particularly pronounced after eating, which the patient does not attribute to any feelings of reflux. However, she is currently on a regimen of Pepcid and Nexium, taken twice daily, for GERD/LPR.  Despite the persistent cough, the patient reports no discomfort or pain. She has been managing the cough with the use of an inhaler (Trelegy) and Robitussin, which she takes before bed. The patient also takes Zyrtec daily due to her known sensitivity to ragweed.  The patient has a history of lung inflammation, specifically NSIP, as identified on a previous chest CT. She also has a history of reflux, which is currently managed with medication.  The patient has not  yet received her flu shot for the year, wants to get it today. She reports need for medication refills of Trelegy at this time.      DATA: 03/16/20 PFTs: FVC 2.11 (84%), FEV1 1.77 (92%), ratio 84, diffusion capacity by Kco 113 Interpretation: No Restriction or obstruction. No BD response. Moderate-severe diffusion defect 03/14/20 CXR: Subtle density in the right upper lobe, early infection is not excluded 07/15/2020 chest CT high resolution: Mild groundglass opacities indeterminate for early NSIP or UIP. 08/03/2022 CT chest high resolution: Appearance similar to prior.  This likely UIP.  Stable small pulmonary nodules.  Possible NSIP.  Coronary calcification noted on CT. 09/11/2022 PFTs: FEV1 1.90 L or 111% of predicted, FVC 2.33 L or 101% predicted, FEV1/FVC 81%.  Lung volumes normal.  Diffusion capacity corrects partly by alveolar volume to 87%, overall improvement from prior.  Review of Systems A 10 point review of systems was performed and it is as noted above otherwise negative.   Patient Active Problem List   Diagnosis Date Noted   Age-related osteoporosis without current pathological fracture 08/01/2022   Moderate persistent asthma without complication 01/30/2022   Bronchitis 04/04/2020   Asthma due to seasonal allergies 04/04/2020   Class 1 obesity due to excess calories with serious comorbidity and body mass index (BMI) of 33.0 to 33.9 in adult 02/10/2020   Gastroesophageal reflux disease without esophagitis 07/04/2017   Obesity (BMI 30.0-34.9) 07/04/2017   Pure hypercholesterolemia 07/04/2017   Osteopenia 07/04/2017    Social History   Tobacco Use   Smoking status: Former    Current packs/day:  0.00    Average packs/day: 1 pack/day for 10.0 years (10.0 ttl pk-yrs)    Types: Cigarettes    Start date: 09/03/1988    Quit date: 09/03/1998    Years since quitting: 24.8   Smokeless tobacco: Never  Substance Use Topics   Alcohol use: No    Comment: rarely    Allergies   Allergen Reactions   Tape Itching    Redness      Current Meds  Medication Sig   albuterol (PROVENTIL) (2.5 MG/3ML) 0.083% nebulizer solution Take 3 mLs (2.5 mg total) by nebulization every 6 (six) hours as needed for wheezing or shortness of breath.   albuterol (VENTOLIN HFA) 108 (90 Base) MCG/ACT inhaler Inhale 2 puffs into the lungs every 4 (four) hours as needed for wheezing or shortness of breath.   Ascorbic Acid (VITAMIN C) 100 MG tablet Take 100 mg by mouth daily.   aspirin EC 81 MG tablet Take 81 mg by mouth daily. Swallow whole.   Calcium Carbonate-Vit D-Min (CALCIUM 600+D PLUS MINERALS) 600-400 MG-UNIT TABS Take 2 tablets by mouth daily.   Cholecalciferol (VITAMIN D-3) 25 MCG (1000 UT) CAPS Take by mouth.   Dexlansoprazole (DEXILANT) 30 MG capsule DR Take 1 capsule (30 mg total) by mouth daily.   erythromycin ophthalmic ointment Apply to sutures 4 times a day for 10-12 days.  Discontinue if allergy develops and call our office   famotidine (PEPCID) 20 MG tablet TAKE 1 TABLET(20 MG) BY MOUTH EVERY NIGHT   Melatonin 5 MG TABS Take 5 mg by mouth as needed.   montelukast (SINGULAIR) 10 MG tablet TAKE 1 TABLET(10 MG) BY MOUTH AT BEDTIME   Multiple Vitamins-Minerals (CENTRUM SILVER 50+WOMEN PO) Take 1 tablet by mouth daily.   Omega-3 Fatty Acids (FISH OIL PO) Take by mouth.   Oyster Shell (OYSTER CALCIUM) 500 MG TABS tablet Take 500 mg of elemental calcium by mouth daily.   simvastatin (ZOCOR) 40 MG tablet Take 1 tablet (40 mg total) by mouth at bedtime. (Patient taking differently: Take 20 mg by mouth at bedtime.)   traMADol (ULTRAM) 50 MG tablet Take 1 every 4-6 hours as needed for pain not controlled by Tylenol   Turmeric (QC TUMERIC COMPLEX PO) Take by mouth.   vitamin B-12 (CYANOCOBALAMIN) 100 MCG tablet Take 100 mcg by mouth daily.   Zinc Acetate 25 MG CAPS Take 25 mg by mouth daily.   zoledronic acid (RECLAST) 5 MG/100ML SOLN injection Inject 5 mg into the vein. Every year  patient states   [DISCONTINUED] TRELEGY ELLIPTA 100-62.5-25 MCG/ACT AEPB INHALE 1 PUFF INTO THE LUNGS DAILY    Immunization History  Administered Date(s) Administered   Fluad Quad(high Dose 65+) 06/18/2019, 07/09/2019, 08/03/2022   Influenza, High Dose Seasonal PF 06/21/2018   Influenza-Unspecified 07/05/2015, 06/03/2016, 06/25/2017, 06/13/2020, 07/10/2021   PFIZER Comirnaty(Gray Top)Covid-19 Tri-Sucrose Vaccine 01/16/2021   PFIZER(Purple Top)SARS-COV-2 Vaccination 12/22/2019, 01/12/2020, 07/18/2020   Pfizer Covid-19 Vaccine Bivalent Booster 75yrs & up 07/17/2021   Pneumococcal Conjugate-13 01/11/2016   Pneumococcal Polysaccharide-23 06/25/2017   Respiratory Syncytial Virus Vaccine,Recomb Aduvanted(Arexvy) 07/04/2022   Td (Adult),5 Lf Tetanus Toxid, Preservative Free 03/23/2016   Tdap 03/23/2016        Objective:     BP 134/82 (BP Location: Right Arm, Cuff Size: Normal)   Pulse 83   Temp 97.8 F (36.6 C)   Ht 5' (1.524 m)   Wt 148 lb (67.1 kg)   SpO2 100%   BMI 28.90 kg/m   SpO2: 100 %  O2 Device: None (Room air)  GENERAL: Well-developed well-nourished woman, no acute distress, well-groomed.  Fully ambulatory.  No conversational dyspnea. HEAD: Normocephalic, atraumatic.  EYES: Pupils equal, round, reactive to light.  No scleral icterus.  MOUTH: Dentition intact, oral mucosa moist.  Tongue not coated. NECK: Supple. No thyromegaly. Trachea midline. No JVD.  No adenopathy. PULMONARY: Good air entry bilaterally.  Clear to auscultation bilaterally. CARDIOVASCULAR: S1 and S2. Regular rate and rhythm.  No rubs, murmurs or gallops heard. GASTROINTESTINAL: Benign. MUSCULOSKELETAL: No joint deformity, no clubbing, no edema.  NEUROLOGIC: No focal deficit, no gait disturbance, speech is fluent. SKIN: Intact,warm,dry. PSYCH: Mood and behavior normal  Lab Results  Component Value Date   NITRICOXIDE 7 07/09/2023    Assessment & Plan:     ICD-10-CM   1. NSIP (nonspecific  interstitial pneumonia) (HCC)  J84.89 ANA    Rheumatoid Factor    Cyclic citrul peptide antibody, IgG ([facility])    Scleroderma Diagnostic Profile    ANCA Profile    ANA+ENA+DNA/DS+Antich+Centr    CT CHEST HIGH RESOLUTION    Pulmonary Function Test [facility] Only    2. Asthmatic bronchitis, moderate persistent, uncomplicated  J45.40     3. Laryngopharyngeal reflux (LPR)  K21.9     4. Chronic cough  R05.3 Nitric oxide    5. Need for influenza vaccination  Z23      Orders Placed This Encounter  Procedures   CT CHEST HIGH RESOLUTION    To be done in Jan    Standing Status:   Future    Standing Expiration Date:   07/08/2024    Order Specific Question:   Preferred imaging location?    Answer:   [facility]   ANA    Standing Status:   Future    Standing Expiration Date:   07/08/2024   Rheumatoid Factor    Standing Status:   Future    Standing Expiration Date:   07/08/2024   Cyclic citrul peptide antibody, IgG ([facility])    Standing Status:   Future    Standing Expiration Date:   07/08/2024   Scleroderma Diagnostic Profile    Standing Status:   Future    Standing Expiration Date:   07/08/2024   ANCA Profile    Standing Status:   Future    Standing Expiration Date:   07/08/2024   ANA+ENA+DNA/DS+Antich+Centr    Standing Status:   Future    Standing Expiration Date:   01/06/2024   Nitric oxide   Pulmonary Function Test [facility] Only    To be done in Jan    Standing Status:   Future    Standing Expiration Date:   07/08/2024    Order Specific Question:   Full PFT: includes the following: basic spirometry, spirometry pre & post bronchodilator, diffusion capacity (DLCO), lung volumes    Answer:   Full PFT    Order Specific Question:   This test can only be performed at    Answer:   [facility]   Meds ordered this encounter  Medications   Fluticasone-Umeclidin-Vilant (TRELEGY ELLIPTA) 100-62.5-25 MCG/ACT AEPB    Sig: Inhale 1 puff into the lungs daily.    Dispense:  60 each    Refill:   6    Order Specific Question:   Lot Number?    Answer:   [id number]    Order Specific Question:   Expiration Date?    Answer:   12/03/2023    Order Specific Question:   Quantity  Answer:   2   Discussion:    Chronic Cough/Chronic Asthmatic Bronchitis Likely secondary to gastroesophageal reflux disease (GERD) and ragweed allergy. Patient reports coughing after eating and during the day, but improves at night. Currently on Pepcid and Dexilant for GERD and Zyrtec for allergies. -Continue current medications. -Check blood work in January to investigate other potential causes of cough. -Repeat high-resolution CT chest in January to monitor for changes in NSIP.  Non-Specific Interstitial Pneumonia (NSIP) Mild changes noted on previous CT chest. No current respiratory distress or abnormal lung sounds on examination. -Repeat high-resolution CT chest in January to monitor for progression. -Repeat pulmonary function tests in January to assess lung function.  General Health Maintenance -Administer influenza vaccine today. -Follow-up appointment scheduled for end of January/early February.      Gailen Shelter, MD Advanced Bronchoscopy PCCM [facility]    *This note was generated using voice recognition software/Dragon and/or AI transcription program.  Despite best efforts to proofread, errors can occur which can change the meaning. Any transcriptional errors that result from this process are unintentional and may not be fully corrected at the time of dictation.

## 2023-07-23 ENCOUNTER — Telehealth: Payer: Self-pay | Admitting: Gastroenterology

## 2023-07-23 NOTE — Telephone Encounter (Signed)
Pt requested  a refill on her medication

## 2023-07-25 ENCOUNTER — Other Ambulatory Visit: Payer: Self-pay | Admitting: Gastroenterology

## 2023-07-25 MED ORDER — FAMOTIDINE 20 MG PO TABS: 20.0000 mg | ORAL_TABLET | Freq: Every day | ORAL | 1 refills | Status: AC

## 2023-07-25 NOTE — Telephone Encounter (Signed)
Last office visit 05/16/2023 Chronic GERD  Plan: Chronic GERD with mild hiatal hernia, no known erosive esophagitis Advised patient to try Dexilant 30 mg daily from 60 mg, prescription sent Okay to continue Pepcid at this time Reiterated on antireflux lifestyle Advised patient to stop eating tamarind every day   Follow up with 3 months follow up with [name]   Has made appointment for 09/11/2023 states she can not come in December because [place]

## 2023-07-25 NOTE — Addendum Note (Signed)
Addended by: Radene Knee L on: 07/25/2023 11:43 AM   Modules accepted: Orders

## 2023-07-25 NOTE — Telephone Encounter (Signed)
Pt requesting call back to discuss refill on Famotidine 20 mg Needs to be called in to [facility] on [address]

## 2023-08-13 ENCOUNTER — Telehealth: Payer: Self-pay

## 2023-08-13 MED ORDER — DEXLANSOPRAZOLE 30 MG PO CPDR: 30.0000 mg | DELAYED_RELEASE_CAPSULE | Freq: Every day | ORAL | 0 refills | Status: DC

## 2023-08-13 NOTE — Telephone Encounter (Signed)
Out of town need a refill of medication sent to pharmacy received a fax from pharmacy

## 2023-08-20 ENCOUNTER — Other Ambulatory Visit: Payer: Self-pay | Admitting: Pulmonary Disease

## 2023-09-11 ENCOUNTER — Ambulatory Visit: Payer: Medicare HMO | Admitting: Physician Assistant

## 2023-09-16 ENCOUNTER — Telehealth: Payer: Self-pay

## 2023-09-16 MED ORDER — DEXLANSOPRAZOLE 30 MG PO CPDR: 30.0000 mg | DELAYED_RELEASE_CAPSULE | Freq: Every day | ORAL | 0 refills | Status: AC

## 2023-09-16 NOTE — Telephone Encounter (Signed)
 Last office visit 05/16/23 chronic GERD  Cancel appointment on 09/11/2023 Last refill 08/13/2023 0 refills 30 tablets  Sent mychart message to schedule appointment

## 2023-09-20 ENCOUNTER — Ambulatory Visit
Admission: RE | Admit: 2023-09-20 | Discharge: 2023-09-20 | Disposition: A | Discharge status: Home / Self Care | Payer: Medicare HMO | Source: Ambulatory Visit | Attending: Family Medicine | Admitting: Family Medicine

## 2023-09-20 DIAGNOSIS — J8489 Other specified interstitial pulmonary diseases: Secondary | ICD-10-CM | POA: Insufficient documentation

## 2023-10-01 ENCOUNTER — Ambulatory Visit: Payer: Medicare HMO | Attending: Pulmonary Disease

## 2023-10-01 DIAGNOSIS — Z87891 Personal history of nicotine dependence: Secondary | ICD-10-CM | POA: Diagnosis not present

## 2023-10-01 DIAGNOSIS — J8489 Other specified interstitial pulmonary diseases: Secondary | ICD-10-CM

## 2023-10-01 DIAGNOSIS — J84113 Idiopathic non-specific interstitial pneumonitis: Secondary | ICD-10-CM | POA: Insufficient documentation

## 2023-10-01 DIAGNOSIS — J188 Other pneumonia, unspecified organism: Secondary | ICD-10-CM | POA: Diagnosis present

## 2023-10-01 LAB — PULMONARY FUNCTION TEST ARMC ONLY
DL/VA % pred: 78 %
DL/VA: 3.31 ml/min/mmHg/L
DLCO unc % pred: 74 %
DLCO unc: 12.37 ml/min/mmHg
FEF 25-75 Post: 2.17 L/s
FEF 25-75 Pre: 2.21 L/s
FEF2575-%Change-Post: -1 %
FEF2575-%Pred-Post: 162 %
FEF2575-%Pred-Pre: 165 %
FEV1-%Change-Post: 2 %
FEV1-%Pred-Post: 114 %
FEV1-%Pred-Pre: 111 %
FEV1-Post: 1.92 L
FEV1-Pre: 1.86 L
FEV1FVC-%Change-Post: 0 %
FEV1FVC-%Pred-Pre: 111 %
FEV6-%Change-Post: 4 %
FEV6-%Pred-Post: 109 %
FEV6-%Pred-Pre: 104 %
FEV6-Post: 2.33 L
FEV6-Pre: 2.23 L
FEV6FVC-%Pred-Post: 105 %
FEV6FVC-%Pred-Pre: 105 %
FVC-%Change-Post: 3 %
FVC-%Pred-Post: 102 %
FVC-%Pred-Pre: 99 %
FVC-Post: 2.33 L
FVC-Pre: 2.24 L
Post FEV1/FVC ratio: 82 %
Post FEV6/FVC ratio: 100 %
Pre FEV1/FVC ratio: 83 %
Pre FEV6/FVC Ratio: 100 %
RV % pred: 99 %
RV: 2.12 L
TLC % pred: 100 %
TLC: 4.46 L

## 2023-10-01 MED ORDER — ALBUTEROL SULFATE (2.5 MG/3ML) 0.083% IN NEBU
2.5000 mg | INHALATION_SOLUTION | Freq: Once | RESPIRATORY_TRACT | Status: AC
  Administered 2023-10-01: 2.5 mg via RESPIRATORY_TRACT
  Filled 2023-10-01: qty 3

## 2023-10-05 ENCOUNTER — Other Ambulatory Visit: Payer: Self-pay | Admitting: Gastroenterology

## 2023-10-08 ENCOUNTER — Ambulatory Visit (INDEPENDENT_AMBULATORY_CARE_PROVIDER_SITE_OTHER): Payer: Medicare HMO | Admitting: Pulmonary Disease

## 2023-10-08 ENCOUNTER — Encounter: Payer: Self-pay | Admitting: Pulmonary Disease

## 2023-10-08 VITALS — BP 118/82 | HR 74 | Temp 97.1°F | Ht 60.0 in | Wt 153.0 lb

## 2023-10-08 DIAGNOSIS — J4540 Moderate persistent asthma, uncomplicated: Secondary | ICD-10-CM

## 2023-10-08 DIAGNOSIS — J45991 Cough variant asthma: Secondary | ICD-10-CM

## 2023-10-08 DIAGNOSIS — J8489 Other specified interstitial pulmonary diseases: Secondary | ICD-10-CM

## 2023-10-08 MED ORDER — TRELEGY ELLIPTA 100-62.5-25 MCG/ACT IN AEPB
1.0000 | INHALATION_SPRAY | Freq: Every day | RESPIRATORY_TRACT | 0 refills | Status: DC
Start: 2023-10-08 — End: 2024-04-22

## 2023-10-08 NOTE — Progress Notes (Signed)
 Subjective:    Patient ID: Jennifer Farmer, female    DOB: July 27, 1946, 78 y.o.   MRN: [id number]  Patient Care Team: Alla Amis, MD as PCP - General (Family Medicine) Darliss Rogue, MD as PCP - Cardiology (Cardiology)  Chief Complaint  Patient presents with   Follow-up    DOE. Occasional wheezing.Dry cough.    BACKGROUND/INTERVAL:Shayli is a 78 year old remote former smoker whom we last saw 09 July 2023.  She has a issues with chronic cough this is believed to be due to asthma/asthmatic bronchitis. She has significant seasonal variation to her symptoms and previously noted allergen test positive for ragweed allergy .  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  At her prior visit she had PFTs and high-resolution chest CT ordered.  She also had connective tissue workup ordered however she did not follow through with the blood sampling.  HPI Discussed the use of AI scribe software for clinical note transcription with the patient, who gave verbal consent to proceed.  History of Present Illness   Jennifer Farmer is a 78 year old female with asthma who presents for follow-up of her lung function and cough. She is accompanied by her husband.  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  Her cough has improved, and she currently experiences no wheezing. She describes her breathing as 'very normal.'  She continues to use Trelegy for asthma management, currently on the 100 mcg dose, but notes that it is 'very expensive.'  She does note however that this controls her cough very well.  During pulmonary function tests, she experienced coughing when asked to exhale forcefully, but this was short-lived.   She does not endorse any other symptomatology today.  Overall she feels well and looks well.  I reviewed the PFTs and high-resolution chest CT with the patient and her  husband.  DATA: 03/16/20 PFTs: FVC 2.11 (84%), FEV1 1.77 (92%), ratio 84, diffusion capacity by Kco 113 Interpretation: No Restriction or obstruction. No BD response. Moderate-severe diffusion defect 03/14/20 CXR: Subtle density in the right upper lobe, early infection is not excluded 07/15/2020 chest CT high resolution: Mild groundglass opacities indeterminate for early NSIP or UIP. 08/03/2022 CT chest high resolution: Appearance similar to prior.  Indeterminate for UIP.  Stable small pulmonary nodules.  Possible NSIP.  Coronary calcification noted on CT. 09/11/2022 PFTs: FEV1 1.90 L or 111% of predicted, FVC 2.33 L or 101% predicted, FEV1/FVC 81%.  Lung volumes normal.  Diffusion capacity corrects partly by alveolar volume to 87%, overall improvement from prior. 09/20/2023 chest CT high-resolution: Mild basilar coarse and subpleural groundglass and subpleural reticulation possible due to fibrotic NSIP, mild air trapping indicative of small airways disease.  Findings are mild and have been stable, less likely UIP. 10/01/2023 PFTs: FEV1 1.86 L or 111% predicted, FVC 2.24 L or 99% predicted, FEV1/FVC 83%, lung volumes normal.  Diffusion capacity mildly reduced as prior.  No significant change from prior.  Review of Systems A 10 point review of systems was performed and it is as noted above otherwise negative.   Patient Active Problem List   Diagnosis Date Noted   NSIP (nonspecific interstitial pneumonia) (HCC) 10/01/2023   Age-related osteoporosis without current pathological fracture 08/01/2022   Moderate persistent asthma without complication 01/30/2022   Bronchitis 04/04/2020   Asthma due to seasonal allergies 04/04/2020   Class 1 obesity due to excess calories with serious comorbidity and body  mass index (BMI) of 33.0 to 33.9 in adult 02/10/2020   Gastroesophageal reflux disease without esophagitis 07/04/2017   Obesity (BMI 30.0-34.9) 07/04/2017   Pure hypercholesterolemia 07/04/2017    Osteopenia 07/04/2017    Social History   Tobacco Use   Smoking status: Former    Current packs/day: 0.00    Average packs/day: 1 pack/day for 10.0 years (10.0 ttl pk-yrs)    Types: Cigarettes    Start date: 09/03/1988    Quit date: 09/03/1998    Years since quitting: 25.1   Smokeless tobacco: Never  Substance Use Topics   Alcohol use: No    Comment: rarely    Allergies  Allergen Reactions   Tape Itching    Redness      Current Meds  Medication Sig   albuterol  (PROVENTIL ) (2.5 MG/3ML) 0.083% nebulizer solution Take 3 mLs (2.5 mg total) by nebulization every 6 (six) hours as needed for wheezing or shortness of breath.   albuterol  (VENTOLIN  HFA) 108 (90 Base) MCG/ACT inhaler Inhale 2 puffs into the lungs every 4 (four) hours as needed for wheezing or shortness of breath.   Ascorbic Acid (VITAMIN C) 100 MG tablet Take 100 mg by mouth daily.   aspirin  EC 81 MG tablet Take 81 mg by mouth daily. Swallow whole.   Calcium Carbonate-Vit D-Min (CALCIUM 600+D PLUS MINERALS) 600-400 MG-UNIT TABS Take 2 tablets by mouth daily.   Cholecalciferol (VITAMIN D-3) 25 MCG (1000 UT) CAPS Take by mouth.   Dexlansoprazole  (DEXILANT ) 30 MG capsule DR Take 1 capsule (30 mg total) by mouth daily.   erythromycin  ophthalmic ointment Apply to sutures 4 times a day for 10-12 days.  Discontinue if allergy  develops and call our office   famotidine  (PEPCID ) 20 MG tablet Take 1 tablet (20 mg total) by mouth at bedtime.   Fluticasone -Umeclidin-Vilant (TRELEGY ELLIPTA ) 100-62.5-25 MCG/ACT AEPB Inhale 1 puff into the lungs daily.   Melatonin 5 MG TABS Take 5 mg by mouth as needed.   meloxicam (MOBIC) 15 MG tablet Take 15 mg by mouth daily.   montelukast  (SINGULAIR ) 10 MG tablet TAKE 1 TABLET(10 MG) BY MOUTH AT BEDTIME   Multiple Vitamins-Minerals (CENTRUM SILVER 50+WOMEN PO) Take 1 tablet by mouth daily.   Omega-3 Fatty Acids (FISH OIL PO) Take by mouth.   Oyster Shell (OYSTER CALCIUM) 500 MG TABS tablet Take 500  mg of elemental calcium by mouth daily.   simvastatin  (ZOCOR ) 40 MG tablet Take 1 tablet (40 mg total) by mouth at bedtime. (Patient taking differently: Take 20 mg by mouth at bedtime.)   tiZANidine (ZANAFLEX) 2 MG tablet Take 2 mg by mouth every 8 (eight) hours as needed.   traMADol  (ULTRAM ) 50 MG tablet Take 1 every 4-6 hours as needed for pain not controlled by Tylenol    TRELEGY ELLIPTA  100-62.5-25 MCG/ACT AEPB INHALE 1 PUFF INTO THE LUNGS DAILY   Turmeric (QC TUMERIC COMPLEX PO) Take by mouth.   vitamin B-12 (CYANOCOBALAMIN) 100 MCG tablet Take 100 mcg by mouth daily.   Zinc Acetate 25 MG CAPS Take 25 mg by mouth daily.   zoledronic acid (RECLAST) 5 MG/100ML SOLN injection Inject 5 mg into the vein. Every year patient states    Immunization History  Administered Date(s) Administered   Fluad Quad(high Dose 65+) 06/18/2019, 07/09/2019, 08/03/2022   Fluad Trivalent(High Dose 65+) 07/09/2023   Influenza, High Dose Seasonal PF 06/21/2018   Influenza-Unspecified 07/05/2015, 06/03/2016, 06/25/2017, 06/13/2020, 07/10/2021   PFIZER Comirnaty(Gray Top)Covid-19 Tri-Sucrose Vaccine 01/16/2021   PFIZER(Purple Top)SARS-COV-2  Vaccination 12/22/2019, 01/12/2020, 07/18/2020   Pfizer Covid-19 Vaccine Bivalent Booster 57yrs & up 07/17/2021   Pneumococcal Conjugate-13 01/11/2016   Pneumococcal Polysaccharide-23 06/25/2017   Respiratory Syncytial Virus Vaccine,Recomb Aduvanted(Arexvy) 07/04/2022   Td (Adult),5 Lf Tetanus Toxid, Preservative Free 03/23/2016   Tdap 03/23/2016        Objective:     BP 118/82 (BP Location: Right Arm, Cuff Size: Normal)   Pulse 74   Temp (!) 97.1 F (36.2 C)   Ht 5' (1.524 m)   Wt 153 lb (69.4 kg)   SpO2 98%   BMI 29.88 kg/m   SpO2: 98 % O2 Device: None (Room air)  GENERAL: Well-developed well-nourished woman, no acute distress, well-groomed.  Fully ambulatory.  No conversational dyspnea. HEAD: Normocephalic, atraumatic.  EYES: Pupils equal, round, reactive  to light.  No scleral icterus.  MOUTH: Dentition intact, oral mucosa moist.  Tongue not coated. NECK: Supple. No thyromegaly. Trachea midline. No JVD.  No adenopathy. PULMONARY: Good air entry bilaterally.  Clear to auscultation bilaterally. CARDIOVASCULAR: S1 and S2. Regular rate and rhythm.  No rubs, murmurs or gallops heard. GASTROINTESTINAL: Benign. MUSCULOSKELETAL: No joint deformity, no clubbing, no edema.  NEUROLOGIC: No focal deficit, no gait disturbance, speech is fluent. SKIN: Intact,warm,dry. PSYCH: Mood and behavior normal   Assessment & Plan:     ICD-10-CM   1. Cough variant asthma  J45.991     2. Asthmatic bronchitis, moderate persistent, uncomplicated  J45.40     3. NSIP (nonspecific interstitial pneumonia) (HCC) - MILD  J84.89      Meds ordered this encounter  Medications   Fluticasone -Umeclidin-Vilant (TRELEGY ELLIPTA ) 100-62.5-25 MCG/ACT AEPB    Sig: Inhale 1 puff into the lungs daily.    Dispense:  28 each    Refill:  0    Lot Number?:   [id number]    Expiration Date?:   01/01/2025    Quantity:   2   Discussion:    Cough VariantAsthma Asthma is well-controlled with improved lung function and minimal scarring on recent scans. The patient reports improved cough and no wheezing. She finds Trelegy expensive. Discussed potential airway reactivity during pulmonary function tests. - Continue Trelegy - Provide Trelegy samples to assist with expenses - Follow-up in six months.     NSIP Mild fibrotic NSIP noted.  Did not get connective tissue disease panel drawn.  High-resolution chest CT and PFTs are stable - Continue to monitor expectantly   Advised if symptoms do not improve or worsen, to please contact office for sooner follow up or seek emergency care.    I spent 30 minutes of dedicated to the care of this patient on the date of this encounter to include pre-visit review of records, face-to-face time with the patient discussing conditions above, post visit  ordering of testing, clinical documentation with the electronic health record, making appropriate referrals as documented, and communicating necessary findings to members of the patients care team.  C. Leita Sanders, MD Advanced Bronchoscopy PCCM [facility]    *This note was generated using voice recognition software/Dragon and/or AI transcription program.  Despite best efforts to proofread, errors can occur which can change the meaning. Any transcriptional errors that result from this process are unintentional and may not be fully corrected at the time of dictation.

## 2023-10-08 NOTE — Patient Instructions (Signed)
 VISIT SUMMARY:  Jennifer Farmer, a 78 year old female with asthma, came in for a follow-up on her lung function and cough. Her lung function has improved, with recent scans showing minimal scarring. Her cough has also improved, and she currently experiences no wheezing. She continues to use Trelegy for asthma management but finds it expensive. During pulmonary function tests, she experiences coughing when asked to exhale forcefully, which she attributes to airway reactivity associated with her asthma.  YOUR PLAN:  -ASTHMA: Asthma is a condition where your airways narrow and swell, producing extra mucus, which can make breathing difficult. Your asthma is well-controlled with improved lung function and minimal scarring on recent scans. Your cough has improved, and you are not experiencing wheezing. You should continue using Trelegy, and we will provide samples to help with the cost. We discussed that the coughing during pulmonary function tests is likely due to airway reactivity. Please follow up in six months.  INSTRUCTIONS:  Please follow up in six months for a re-evaluation of your asthma and lung function.

## 2023-10-09 ENCOUNTER — Encounter: Payer: Self-pay | Admitting: Pulmonary Disease

## 2023-10-16 ENCOUNTER — Telehealth: Payer: Self-pay | Admitting: Cardiology

## 2023-10-16 ENCOUNTER — Other Ambulatory Visit: Payer: Self-pay | Admitting: Family Medicine

## 2023-10-16 DIAGNOSIS — Z1231 Encounter for screening mammogram for malignant neoplasm of breast: Secondary | ICD-10-CM

## 2023-10-16 NOTE — Telephone Encounter (Signed)
Pt called in asking about pt's medical history.

## 2023-10-16 NOTE — Telephone Encounter (Signed)
Left message for pt to call back, as I am unsure what she is calling about.

## 2023-10-17 NOTE — Telephone Encounter (Signed)
Left a message for the patient to call back.

## 2023-10-18 ENCOUNTER — Ambulatory Visit
Admission: RE | Admit: 2023-10-18 | Discharge: 2023-10-18 | Disposition: A | Discharge status: Home / Self Care | Payer: Medicare HMO | Source: Ambulatory Visit | Attending: Family Medicine | Admitting: Family Medicine

## 2023-10-18 DIAGNOSIS — Z1231 Encounter for screening mammogram for malignant neoplasm of breast: Secondary | ICD-10-CM | POA: Diagnosis present

## 2023-10-18 NOTE — Telephone Encounter (Signed)
Left a message for the patient to call back if anything was needed.

## 2023-11-07 ENCOUNTER — Telehealth: Payer: Self-pay | Admitting: Pulmonary Disease

## 2023-11-07 MED ORDER — MONTELUKAST SODIUM 10 MG PO TABS: ORAL_TABLET | ORAL | 5 refills | Status: DC

## 2023-11-07 NOTE — Telephone Encounter (Signed)
 Rx sent to pharmacy

## 2023-11-07 NOTE — Telephone Encounter (Signed)
 Patient needs refill of montelukast Jennifer Farmer)  Pharmacy: [facility] on [facility]

## 2023-12-19 ENCOUNTER — Encounter: Payer: Self-pay | Admitting: Cardiology

## 2023-12-19 ENCOUNTER — Ambulatory Visit: Attending: Cardiology | Admitting: Cardiology

## 2023-12-19 VITALS — BP 118/60 | HR 79 | Ht 60.0 in | Wt 151.0 lb

## 2023-12-19 DIAGNOSIS — E7800 Pure hypercholesterolemia, unspecified: Secondary | ICD-10-CM | POA: Diagnosis not present

## 2023-12-19 DIAGNOSIS — I2510 Atherosclerotic heart disease of native coronary artery without angina pectoris: Secondary | ICD-10-CM | POA: Diagnosis not present

## 2023-12-19 NOTE — Progress Notes (Signed)
 Cardiology Office Note:    Date:  12/19/2023   ID:  Jennifer Farmer, DOB 14-Nov-1945, MRN [id number]  PCP:  Marisue Ivan, MD   [facility] Providers Cardiologist:  Debbe Odea, MD     Referring MD: Marisue Ivan, MD   Chief Complaint  Patient presents with   12 month follow up     "Doing well."     History of Present Illness:    Jennifer Farmer is a 78 y.o. female with a hx of coronary calcification, asthma, hyperlipidemia, former smoker x 10 years who presents for follow-up.  Doing okay since last visit.  Denies chest pain or shortness of breath.  Compliant with medications as prescribed.  Feels well has no concerns at this time.  Takes aspirin 81 mg, simvastatin 20 mg daily, last cholesterol check was 2 days ago.   Prior notes/studies Echo 12/2022 EF 55 to 60% Coronary CTA 11/2022 coronary calcium 21.8, minimal proximal LAD stenosis less than 25%. Patient had chest CT 08/2022 due to cough which showed LAD calcification and aortic arch atherosclerosis.  Past Medical History:  Diagnosis Date   Edema    FEET/LEGS   Enlarged thyroid    GERD (gastroesophageal reflux disease)    Hyperlipidemia    Hypothyroidism    Motion sickness    boats   Obesity    Obesity    Osteopenia    Palpitations    Pure hypercholesterolemia with target low density lipoprotein (LDL) cholesterol less than 130 mg/dL     Past Surgical History:  Procedure Laterality Date   ABDOMINAL HYSTERECTOMY     BROW LIFT Bilateral 05/24/2023   Procedure: BLEPHAROPLASTY UPPER EYELID; W/ EXCESS SKIN BILATERAL;  Surgeon: Imagene Riches, MD;  Location: [facility];  Service: Ophthalmology;  Laterality: Bilateral;   CATARACT EXTRACTION W/PHACO Right 09/19/2017   Procedure: CATARACT EXTRACTION PHACO AND INTRAOCULAR LENS PLACEMENT (IOC);  Surgeon: Nevada Crane, MD;  Location: [facility];  Service: Ophthalmology;  Laterality: Right;  [place] 00:17.1 AP% 8.7 CDE 1.49 Fluid Pack Lot #  [id number]   CATARACT EXTRACTION W/PHACO Left 10/10/2017   Procedure: CATARACT EXTRACTION PHACO AND INTRAOCULAR LENS PLACEMENT (IOC);  Surgeon: Nevada Crane, MD;  Location: [facility];  Service: Ophthalmology;  Laterality: Left;  fluid pack lot # [id number]  exp10/31/2020 [place]   00:20.5 AP%   10.7 CDE    2.9   CESAREAN SECTION     COLONOSCOPY WITH PROPOFOL N/A 11/28/2016   Procedure: COLONOSCOPY WITH PROPOFOL;  Surgeon: Scot Jun, MD;  Location: [facility];  Service: Endoscopy;  Laterality: N/A;   ESOPHAGOGASTRODUODENOSCOPY (EGD) WITH PROPOFOL N/A 03/04/2019   Procedure: ESOPHAGOGASTRODUODENOSCOPY (EGD) WITH PROPOFOL;  Surgeon: Tol[place], Boykin Nearing, MD;  Location: [facility];  Service: Gastroenterology;  Laterality: N/A;   EYE SURGERY     KNEE ARTHROSCOPY Left 06/26/2018   Procedure: ARTHROSCOPY KNEE WITH PARTIAL MEDIAL MENISECTOMY;  Surgeon: Signa Kell, MD;  Location: [facility];  Service: Orthopedics;  Laterality: Left;    Current Medications: Current Meds  Medication Sig   albuterol (PROVENTIL) (2.5 MG/3ML) 0.083% nebulizer solution Take 3 mLs (2.5 mg total) by nebulization every 6 (six) hours as needed for wheezing or shortness of breath.   albuterol (VENTOLIN HFA) 108 (90 Base) MCG/ACT inhaler Inhale 2 puffs into the lungs every 4 (four) hours as needed for wheezing or shortness of breath.   Ascorbic Acid (VITAMIN C) 100 MG tablet Take 100 mg by mouth daily.   aspirin EC 81 MG  tablet Take 81 mg by mouth daily. Swallow whole.   Calcium Carbonate-Vit D-Min (CALCIUM 600+D PLUS MINERALS) 600-400 MG-UNIT TABS Take 2 tablets by mouth daily.   Cholecalciferol (VITAMIN D-3) 25 MCG (1000 UT) CAPS Take by mouth.   Dexlansoprazole (DEXILANT) 30 MG capsule DR Take 1 capsule (30 mg total) by mouth daily.   famotidine (PEPCID) 20 MG tablet Take 1 tablet (20 mg total) by mouth at bedtime.   Fluticasone-Umeclidin-Vilant (TRELEGY ELLIPTA) 100-62.5-25 MCG/ACT AEPB Inhale 1 puff into the  lungs daily.   Melatonin 5 MG TABS Take 5 mg by mouth as needed.   meloxicam (MOBIC) 15 MG tablet Take 15 mg by mouth daily.   montelukast (SINGULAIR) 10 MG tablet TAKE 1 TABLET(10 MG) BY MOUTH AT BEDTIME   Multiple Vitamins-Minerals (CENTRUM SILVER 50+WOMEN PO) Take 1 tablet by mouth daily.   Omega-3 Fatty Acids (FISH OIL PO) Take by mouth.   Oyster Shell (OYSTER CALCIUM) 500 MG TABS tablet Take 500 mg of elemental calcium by mouth daily.   simvastatin (ZOCOR) 20 MG tablet Take 20 mg by mouth daily.   tiZANidine (ZANAFLEX) 2 MG tablet Take 2 mg by mouth every 8 (eight) hours as needed.   Turmeric (QC TUMERIC COMPLEX PO) Take by mouth.   vitamin B-12 (CYANOCOBALAMIN) 100 MCG tablet Take 100 mcg by mouth daily.   Zinc Acetate 25 MG CAPS Take 25 mg by mouth daily.   zoledronic acid (RECLAST) 5 MG/100ML SOLN injection Inject 5 mg into the vein. Every year patient states     Allergies:   Tape   Social History   Socioeconomic History   Marital status: Married    Spouse name: Not on file   Number of children: Not on file   Years of education: Not on file   Highest education level: Not on file  Occupational History   Not on file  Tobacco Use   Smoking status: Former    Current packs/day: 0.00    Average packs/day: 1 pack/day for 10.0 years (10.0 ttl pk-yrs)    Types: Cigarettes    Start date: 09/03/1988    Quit date: 09/03/1998    Years since quitting: 25.3   Smokeless tobacco: Never  Vaping Use   Vaping status: Never Used  Substance and Sexual Activity   Alcohol use: No    Comment: rarely   Drug use: No   Sexual activity: Not on file  Other Topics Concern   Not on file  Social History Narrative   Not on file   Social Drivers of Health   Financial Resource Strain: Low Risk  (07/16/2023)   Received from [facility]   Overall Financial Resource Strain (CARDIA)    Difficulty of Paying Living Expenses: Not hard at all  Food Insecurity: No Food Insecurity  (07/16/2023)   Received from [facility]   Hunger Vital Sign    Worried About Running Out of Food in the Last Year: Never true    Ran Out of Food in the Last Year: Never true  Transportation Needs: No Transportation Needs (07/16/2023)   Received from [facility] - Transportation    In the past 12 months, has lack of transportation kept you from medical appointments or from getting medications?: No    Lack of Transportation (Non-Medical): No  Physical Activity: Not on file  Stress: Not on file  Social Connections: Not on file     Family History: The patient's family  history includes Breast cancer (age of onset: 13) in an other family member; Heart Problems in her brother; Hypertension in her brother and father; Stroke in her father.  ROS:   Please see the history of present illness.     All other systems reviewed and are negative.  EKGs/Labs/Other Studies Reviewed:    The following studies were reviewed today:   EKG Interpretation Date/Time:  Thursday December 19 2023 10:29:57 EDT Ventricular Rate:  79 PR Interval:  146 QRS Duration:  72 QT Interval:  394 QTC Calculation: 451 R Axis:   33  Text Interpretation: Normal sinus rhythm Normal ECG Confirmed by Debbe Odea ([id number]) on 12/19/2023 10:50:20 AM    Recent Labs: No results found for requested labs within last 365 days.  Recent Lipid Panel No results found for: "CHOL", "TRIG", "HDL", "CHOLHDL", "VLDL", "LDLCALC", "LDLDIRECT"  Outside lipid panel 06/07/2022 total cholesterol 185, HDL 66.9, LDL 92, triglyceride 129.  Lipid panel [employer]/15/2025.  Total cholesterol 150, LDL 74, triglycerides 82, HDL 59,  Risk Assessment/Calculations:             Physical Exam:    VS:  BP 118/60 (BP Location: Left Arm, Patient Position: Sitting, Cuff Size: Normal)   Pulse 79   Ht 5' (1.524 m)   Wt 151 lb (68.5 kg)   SpO2 98%   BMI 29.49 kg/m     Wt Readings from Last 3  Encounters:  12/19/23 151 lb (68.5 kg)  10/08/23 153 lb (69.4 kg)  07/09/23 148 lb (67.1 kg)     GEN:  Well nourished, well developed in no acute distress HEENT: Normal NECK: No JVD; No carotid bruits CARDIAC: RRR, no murmurs, rubs, gallops RESPIRATORY:  Clear to auscultation without rales, wheezing or rhonchi  ABDOMEN: Soft, non-tender, non-distended MUSCULOSKELETAL:  No edema; No deformity  SKIN: Warm and dry NEUROLOGIC:  Alert and oriented x 3 PSYCHIATRIC:  Normal affect   ASSESSMENT:    1. Coronary artery disease involving native coronary artery of native heart, unspecified whether angina present   2. Pure hypercholesterolemia    PLAN:    In order of problems listed above:  Minimal nonobstructive CAD.  Denies chest pain.  Continue aspirin 81 mg, simvastatin to 40 mg.  Previous LDL 92.   Echo 4/24 EF 55 to 60%. Hyperlipidemia, cholesterol controlled.  Continue simvastatin 20 mg daily.    Follow-up in 1 year     Medication Adjustments/Labs and Tests Ordered: Current medicines are reviewed at length with the patient today.  Concerns regarding medicines are outlined above.  Orders Placed This Encounter  Procedures   EKG 12-Lead   No orders of the defined types were placed in this encounter.   Patient Instructions    Follow-Up: At [facility], you and your health needs are our priority.  As part of our continuing mission to provide you with exceptional heart care, our providers are all part of one team.  This team includes your primary Cardiologist (physician) and Advanced Practice Providers or APPs (Physician Assistants and Nurse Practitioners) who all work together to provide you with the care you need, when you need it.  Your next appointment:   12 month(s)  Provider:   Debbe Odea, MD               Signed, Debbe Odea, MD  12/19/2023 11:20 AM    [facility]

## 2023-12-19 NOTE — Patient Instructions (Signed)
   Follow-Up: At [facility], you and your health needs are our priority.  As part of our continuing mission to provide you with exceptional heart care, our providers are all part of one team.  This team includes your primary Cardiologist (physician) and Advanced Practice Providers or APPs (Physician Assistants and Nurse Practitioners) who all work together to provide you with the care you need, when you need it.  Your next appointment:   12 month(s)  Provider:   Constancia Delton, MD

## 2024-04-22 ENCOUNTER — Other Ambulatory Visit: Payer: Self-pay

## 2024-04-22 MED ORDER — TRELEGY ELLIPTA 100-62.5-25 MCG/ACT IN AEPB
1.0000 | INHALATION_SPRAY | Freq: Every day | RESPIRATORY_TRACT | 2 refills | Status: DC
Start: 2024-04-22 — End: 2024-06-23

## 2024-04-24 ENCOUNTER — Other Ambulatory Visit: Payer: Self-pay

## 2024-04-24 MED ORDER — MONTELUKAST SODIUM 10 MG PO TABS: ORAL_TABLET | ORAL | 5 refills | Status: AC

## 2024-04-30 ENCOUNTER — Ambulatory Visit: Admitting: Pulmonary Disease

## 2024-06-23 ENCOUNTER — Ambulatory Visit: Admitting: Pulmonary Disease

## 2024-06-23 ENCOUNTER — Encounter: Payer: Self-pay | Admitting: Pulmonary Disease

## 2024-06-23 VITALS — BP 136/80 | HR 76 | Temp 97.6°F | Ht 60.0 in | Wt 151.2 lb

## 2024-06-23 DIAGNOSIS — J45991 Cough variant asthma: Secondary | ICD-10-CM

## 2024-06-23 DIAGNOSIS — K219 Gastro-esophageal reflux disease without esophagitis: Secondary | ICD-10-CM

## 2024-06-23 DIAGNOSIS — J8489 Other specified interstitial pulmonary diseases: Secondary | ICD-10-CM

## 2024-06-23 DIAGNOSIS — J45909 Unspecified asthma, uncomplicated: Secondary | ICD-10-CM | POA: Diagnosis not present

## 2024-06-23 DIAGNOSIS — Z87891 Personal history of nicotine dependence: Secondary | ICD-10-CM

## 2024-06-23 DIAGNOSIS — J4540 Moderate persistent asthma, uncomplicated: Secondary | ICD-10-CM

## 2024-06-23 MED ORDER — TRELEGY ELLIPTA 200-62.5-25 MCG/ACT IN AEPB: 1.0000 | INHALATION_SPRAY | Freq: Every day | RESPIRATORY_TRACT | 11 refills | Status: AC

## 2024-06-23 NOTE — Progress Notes (Unsigned)
 Subjective:    Patient ID: Jennifer Farmer, female    DOB: 26-May-1946, 78 y.o.   MRN: [id number]  Patient Care Team: Alla Amis, MD as PCP - General (Family Medicine) Darliss Rogue, MD as PCP - Cardiology (Cardiology)  Chief Complaint  Patient presents with   Asthma    BACKGROUND/INTERVAL:Jennifer Farmer is a 78 year old remote former smoker whom we last saw 09 July 2023.  She has a issues with chronic cough this is believed to be due to asthma/asthmatic bronchitis. 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  At her prior visit she had PFTs and high-resolution chest CT ordered.  She also had connective tissue workup ordered however she did not follow through with the blood sampling.  She was last seen here on 08 October 2023.  HPI Discussed the use of AI scribe software for clinical note transcription with the patient, who gave verbal consent to proceed.  History of Present Illness   Jennifer Farmer is a 78 year old female with variant asthma who presents for follow-up due to recent exacerbation of symptoms. She is accompanied by her husband, Jennifer Farmer.  She has a history of cough variant asthma and recently experienced an exacerbation of symptoms. Her symptoms were stable until she returned to [place]  from [place]  on Sunday, after a 58-day stay. She attributes the exacerbation to the change in weather and increased exposure to allergens such as ragweed.  She is currently using Trelegy for asthma management.  Overall, this medication has been effective to control her symptoms.  She also uses an unspecified allergy medication, which she finds helpful, and does not use Benadryl.  A recent blood test showed elevated eosinophil levels. She has not yet received her flu shot and expresses a dislike for it.  Her last breathing test  was conducted in January, and she was informed that she performed well. She is not due for another test until January of the following year.      DATA: 03/16/20 PFTs: FVC 2.11 (84%), FEV1 1.77 (92%), ratio 84, diffusion capacity by Kco 113 Interpretation: No Restriction or obstruction. No BD response. Moderate-severe diffusion defect 03/14/20 CXR: Subtle density in the right upper lobe, early infection is not excluded 07/15/2020 chest CT high resolution: Mild groundglass opacities indeterminate for early NSIP or UIP. 08/03/2022 CT chest high resolution: Appearance similar to prior.  Indeterminate for UIP.  Stable small pulmonary nodules.  Possible NSIP.  Coronary calcification noted on CT. 09/11/2022 PFTs: FEV1 1.90 L or 111% of predicted, FVC 2.33 L or 101% predicted, FEV1/FVC 81%.  Lung volumes normal.  Diffusion capacity corrects partly by alveolar volume to 87%, overall improvement from prior. 09/20/2023 chest CT high-resolution: Mild basilar coarse and subpleural groundglass and subpleural reticulation possible due to fibrotic NSIP, mild air trapping indicative of small airways disease.  Findings are mild and have been stable, less likely UIP. 10/01/2023 PFTs: FEV1 1.86 L or 111% predicted, FVC 2.24 L or 99% predicted, FEV1/FVC 83%, lung volumes normal.  Diffusion capacity mildly reduced as prior.  No significant change from prior.   Review of Systems A 10 point review of systems was performed and it is as noted above otherwise negative.   Patient Active Problem List   Diagnosis Date Noted   NSIP (nonspecific interstitial pneumonia) (HCC) 10/01/2023   Age-related osteoporosis without current pathological fracture 08/01/2022   Moderate persistent asthma  without complication 01/30/2022   Bronchitis 04/04/2020   Asthma due to seasonal allergies 04/04/2020   Class 1 obesity due to excess calories with serious comorbidity and body mass index (BMI) of 33.0 to 33.9 in adult 02/10/2020    Gastroesophageal reflux disease without esophagitis 07/04/2017   Obesity (BMI 30.0-34.9) 07/04/2017   Pure hypercholesterolemia 07/04/2017   Osteopenia 07/04/2017    Social History   Tobacco Use   Smoking status: Former    Current packs/day: 0.00    Average packs/day: 1 pack/day for 10.0 years (10.0 ttl pk-yrs)    Types: Cigarettes    Start date: 09/03/1988    Quit date: 09/03/1998    Years since quitting: 25.8   Smokeless tobacco: Never  Substance Use Topics   Alcohol use: No    Comment: rarely    Allergies  Allergen Reactions   Tape Itching    Redness      Current Meds  Medication Sig   albuterol  (PROVENTIL ) (2.5 MG/3ML) 0.083% nebulizer solution Take 3 mLs (2.5 mg total) by nebulization every 6 (six) hours as needed for wheezing or shortness of breath.   albuterol  (VENTOLIN  HFA) 108 (90 Base) MCG/ACT inhaler Inhale 2 puffs into the lungs every 4 (four) hours as needed for wheezing or shortness of breath.   Ascorbic Acid (VITAMIN C) 100 MG tablet Take 100 mg by mouth daily.   aspirin  EC 81 MG tablet Take 81 mg by mouth daily. Swallow whole.   Calcium Carbonate-Vit D-Min (CALCIUM 600+D PLUS MINERALS) 600-400 MG-UNIT TABS Take 2 tablets by mouth daily.   Cholecalciferol (VITAMIN D-3) 25 MCG (1000 UT) CAPS Take by mouth.   Dexlansoprazole  (DEXILANT ) 30 MG capsule DR Take 1 capsule (30 mg total) by mouth daily.   famotidine  (PEPCID ) 20 MG tablet Take 1 tablet (20 mg total) by mouth at bedtime.   Fluticasone -Umeclidin-Vilant (TRELEGY ELLIPTA ) 200-62.5-25 MCG/ACT AEPB Inhale 1 puff into the lungs daily.   Melatonin 5 MG TABS Take 5 mg by mouth as needed.   montelukast  (SINGULAIR ) 10 MG tablet TAKE 1 TABLET(10 MG) BY MOUTH AT BEDTIME   Multiple Vitamins-Minerals (CENTRUM SILVER 50+WOMEN PO) Take 1 tablet by mouth daily.   Omega-3 Fatty Acids (FISH OIL PO) Take by mouth.   simvastatin  (ZOCOR ) 20 MG tablet Take 20 mg by mouth daily.   Turmeric (QC TUMERIC COMPLEX PO) Take by mouth.    vitamin B-12 (CYANOCOBALAMIN) 100 MCG tablet Take 100 mcg by mouth daily.   Zinc Acetate 25 MG CAPS Take 25 mg by mouth daily.   zoledronic acid (RECLAST) 5 MG/100ML SOLN injection Inject 5 mg into the vein. Every year patient states   [DISCONTINUED] Fluticasone -Umeclidin-Vilant (TRELEGY ELLIPTA ) 100-62.5-25 MCG/ACT AEPB Inhale 1 puff into the lungs daily in the afternoon.   [DISCONTINUED] TRELEGY ELLIPTA  100-62.5-25 MCG/ACT AEPB INHALE 1 PUFF INTO THE LUNGS DAILY    Immunization History  Administered Date(s) Administered   Fluad Quad(high Dose 65+) 06/18/2019, 07/09/2019, 08/03/2022   Fluad Trivalent(High Dose 65+) 07/09/2023   INFLUENZA, HIGH DOSE SEASONAL PF 06/21/2018   Influenza-Unspecified 07/05/2015, 06/03/2016, 06/25/2017, 06/13/2020, 07/10/2021   PFIZER Comirnaty(Gray Top)Covid-19 Tri-Sucrose Vaccine 01/16/2021   PFIZER(Purple Top)SARS-COV-2 Vaccination 12/22/2019, 01/12/2020, 07/18/2020   Pfizer Covid-19 Vaccine Bivalent Booster 51yrs & up 07/17/2021   Pneumococcal Conjugate-13 01/11/2016   Pneumococcal Polysaccharide-23 06/25/2017   Respiratory Syncytial Virus Vaccine,Recomb Aduvanted(Arexvy) 07/04/2022   Td (Adult),5 Lf Tetanus Toxid, Preservative Free 03/23/2016   Tdap 03/23/2016        Objective:  BP 136/80   Pulse 76   Temp 97.6 F (36.4 C) (Temporal)   Ht 5' (1.524 m)   Wt 151 lb 3.2 oz (68.6 kg)   SpO2 97%   BMI 29.53 kg/m   SpO2: 97 %  GENERAL: Well-developed well-nourished woman, no acute distress, well-groomed.  Fully ambulatory.  No conversational dyspnea. HEAD: Normocephalic, atraumatic.  EYES: Pupils equal, round, reactive to light.  No scleral icterus.  MOUTH: Dentition intact, oral mucosa moist.  Tongue not coated. NECK: Supple. No thyromegaly. Trachea midline. No JVD.  No adenopathy. PULMONARY: Good air entry bilaterally.  Clear to auscultation bilaterally. CARDIOVASCULAR: S1 and S2. Regular rate and rhythm.  No rubs, murmurs or gallops  heard. GASTROINTESTINAL: Benign. MUSCULOSKELETAL: No joint deformity, no clubbing, no edema.  NEUROLOGIC: No focal deficit, no gait disturbance, speech is fluent. SKIN: Intact,warm,dry. PSYCH: Mood and behavior normal.        Assessment & Plan:     ICD-10-CM   1. Cough variant asthma  J45.991     2. Asthmatic bronchitis, moderate persistent, uncomplicated  J45.40 Pulmonary function test    3. NSIP (nonspecific interstitial pneumonia) (HCC) - MILD  J84.89     4. Laryngopharyngeal reflux (LPR)  K21.9       Orders Placed This Encounter  Procedures   Pulmonary function test    Standing Status:   Future    Expiration Date:   06/23/2025    Where should this test be performed?:   Outpatient Pulmonary    What type of PFT is being ordered?:   Full PFT    Meds ordered this encounter  Medications   Fluticasone -Umeclidin-Vilant (TRELEGY ELLIPTA ) 200-62.5-25 MCG/ACT AEPB    Sig: Inhale 1 puff into the lungs daily.    Dispense:  60 each    Refill:  11   Discussion:    Asthma with allergic rhinitis Exacerbated by recent weather changes and ragweed exposure. Elevated allergy-fighting cells in recent blood work. Currently using Trelegy with low steroid content, not concerning for bone health. Allergy medication provides some relief. - Increased Trelegy dosage to 200 mcg to manage symptoms until next visit in late January.  - Revisit pulmonary function testing.    Advised if symptoms do not improve or worsen, to please contact office for sooner follow up or seek emergency care.    I spent 25 minutes of dedicated to the care of this patient on the date of this encounter to include pre-visit review of records, face-to-face time with the patient discussing conditions above, post visit ordering of testing, clinical documentation with the electronic health record, making appropriate referrals as documented, and communicating necessary findings to members of the patients care  team.     C. Leita Sanders, MD Advanced Bronchoscopy PCCM [facility]    *This note was generated using voice recognition software/Dragon and/or AI transcription program.  Despite best efforts to proofread, errors can occur which can change the meaning. Any transcriptional errors that result from this process are unintentional and may not be fully corrected at the time of dictation.

## 2024-06-23 NOTE — Patient Instructions (Signed)
 VISIT SUMMARY:  Today, you came in for a follow-up appointment due to a recent worsening of your asthma symptoms. You mentioned that your symptoms worsened after returning to [place]  from [place] , likely due to the change in weather and increased exposure to allergens like ragweed. You are currently using Trelegy for asthma management and an unspecified allergy medication, which you find helpful. You also had a recent blood test showing elevated eosinophil levels and have not yet received your flu shot.  YOUR PLAN:  -ASTHMA WITH ALLERGIC RHINITIS: Asthma with allergic rhinitis means that your asthma symptoms are worsened by allergies, such as those caused by ragweed. Your recent blood test showed elevated levels of eosinophils, which are cells that fight allergies. To help manage your symptoms, we have increased your Trelegy dosage to 200 mcg until your next visit in late January. Your current allergy medication seems to be providing some relief, and the low steroid content in Trelegy is not a concern for your bone health.  INSTRUCTIONS:  Please schedule your next visit for late January. Additionally, consider getting your flu shot soon, as it can help prevent respiratory infections that might worsen your asthma.

## 2024-06-26 ENCOUNTER — Encounter: Payer: Self-pay | Admitting: Pulmonary Disease

## 2024-10-12 ENCOUNTER — Ambulatory Visit

## 2024-12-18 ENCOUNTER — Ambulatory Visit: Admitting: Cardiology
